# Patient Record
Sex: Male | Born: 1985 | Hispanic: No | Marital: Married | Smoking: Former smoker
Health system: Southern US, Community
[De-identification: ages and names within clinical notes are randomized; demographics above are authoritative.]

## PROBLEM LIST (undated history)

## (undated) DIAGNOSIS — T8859XA Other complications of anesthesia, initial encounter: Secondary | ICD-10-CM

## (undated) DIAGNOSIS — I499 Cardiac arrhythmia, unspecified: Secondary | ICD-10-CM

## (undated) DIAGNOSIS — R7303 Prediabetes: Secondary | ICD-10-CM

## (undated) DIAGNOSIS — L732 Hidradenitis suppurativa: Secondary | ICD-10-CM

## (undated) DIAGNOSIS — G4730 Sleep apnea, unspecified: Secondary | ICD-10-CM

## (undated) DIAGNOSIS — J45909 Unspecified asthma, uncomplicated: Secondary | ICD-10-CM

## (undated) DIAGNOSIS — G43909 Migraine, unspecified, not intractable, without status migrainosus: Secondary | ICD-10-CM

## (undated) DIAGNOSIS — M549 Dorsalgia, unspecified: Secondary | ICD-10-CM

## (undated) DIAGNOSIS — T4145XA Adverse effect of unspecified anesthetic, initial encounter: Secondary | ICD-10-CM

## (undated) HISTORY — DX: Dorsalgia, unspecified: M54.9

## (undated) HISTORY — DX: Migraine, unspecified, not intractable, without status migrainosus: G43.909

## (undated) HISTORY — PX: OTHER SURGICAL HISTORY: SHX169

---

## 2016-12-02 ENCOUNTER — Other Ambulatory Visit (HOSPITAL_COMMUNITY): Payer: Self-pay | Admitting: General Surgery

## 2016-12-05 ENCOUNTER — Ambulatory Visit: Payer: BLUE CROSS/BLUE SHIELD | Admitting: Surgery

## 2016-12-12 ENCOUNTER — Ambulatory Visit: Payer: BLUE CROSS/BLUE SHIELD | Admitting: Surgery

## 2016-12-13 ENCOUNTER — Encounter: Payer: Self-pay | Admitting: Skilled Nursing Facility1

## 2016-12-13 ENCOUNTER — Encounter: Payer: BLUE CROSS/BLUE SHIELD | Attending: General Surgery | Admitting: Skilled Nursing Facility1

## 2016-12-13 DIAGNOSIS — Z713 Dietary counseling and surveillance: Secondary | ICD-10-CM | POA: Insufficient documentation

## 2016-12-13 DIAGNOSIS — Z6841 Body Mass Index (BMI) 40.0 and over, adult: Secondary | ICD-10-CM | POA: Diagnosis not present

## 2016-12-13 DIAGNOSIS — E669 Obesity, unspecified: Secondary | ICD-10-CM

## 2016-12-13 NOTE — Progress Notes (Signed)
Pre-Op Assessment Visit:  Pre-Operative Sleeve Gastrectomy Surgery  Medical Nutrition Therapy:  Appt start time: 9:22  End time:  10:13  Patient was seen on 12/13/2016 for Pre-Operative Nutrition Assessment. Assessment and [facility] coordinator on 12/13/2016.  Sciatica and skin condition prevent from physical activity. Pt states he is going to move back to [place] after the surgery is done. Moved to [place] from [place] just for surgery. Pt states he just started trying Meal replacement shake. Pt needs to be under BMI 60. Pt states he will do whatever the dietitian tells him to do but he knows it will be hard.   Start weight at [facility]: 450.9 pounds BMI: 66.35  24 hr Dietary Recall: First Meal: none Snack:  Second Meal: sub or sandwich from gas station---couple chicken sandwiches from [employer]  Snack:  Third Meal: order pizza or frozen pizza----cereal---chicken and vegetables  Snack:  Beverages: soda, 2% milk, water  Encouraged to engage in 45 minutes of moderate physical activity including cardiovascular and weight baring weekly  Handouts given during visit include:  . Pre-Op Goals . Bariatric Surgery Protein Shakes During the appointment today the following Pre-Op Goals were reviewed with the patient: . Maintain or lose weight as instructed by your surgeon . Make healthy food choices . Begin to limit portion sizes . Limited concentrated sugars and fried foods . Keep fat/sugar in the single digits per serving on         food labels . Practice CHEWING your food  (aim for 30 chews per bite or until applesauce consistency) . Practice not drinking 15 minutes before, during, and 30 minutes after each meal/snack . Avoid all carbonated beverages  . Avoid/limit caffeinated beverages  . Avoid all sugar-sweetened beverages . Consume 3 meals per day; eat every 3-5 hours . Make a list of non-food related activities . Aim for 64-100  ounces of FLUID daily  . Aim for at least 60-80 grams of PROTEIN daily . Look for a liquid protein source that contain ?15 g protein and ?5 g carbohydrate  (ex: shakes, drinks, shots) Call [facility]  and ask how many [facility] appointments you need and also your insurance company   Drink your meal replacement in the morning for breakfast   Arm chair exercises once a day  Take your dogs for a 5 minute walk 2 times a day  Stop buying the soda -Follow diet recommendations listed below   Energy and Macronutrient Recomendations: Calories: 1800 Carbohydrate: 200 Protein: 135 Fat: 50  Demonstrated degree of understanding via:  Teach Back  Teaching Method Utilized:  Visual Auditory Hands on  Barriers to learning/adherence to lifestyle change: none identified   Patient to call the [facility] Education Services to enroll in Pre-Op and Post-Op Nutrition Education when surgery date is scheduled.

## 2016-12-13 NOTE — Patient Instructions (Addendum)
Follow Pre-Op Goals Try Protein Shakes Call [facility] at [phone] when surgery is scheduled to enroll in Pre-Op Class  Things to remember:  Please always be honest with us. We want to support you!  If you have any questions or concerns in between appointments, please call or email   The diet after surgery will be high protein and low in carbohydrate.  Vitamins and calcium need to be taken for the rest of your life.  Feel free to include support people in any classes or appointments.  Call [facility] and ask how many SW[facility] appointments you need and also your insurance company   Drink your meal replacement in the morning for breakfast   Arm chair exercises once a day  Take your dogs for a 5 minute walk 2 times a day  Stop buying the soda Supplement recommendations:  Before Surgery   1 Complete Multivitamin with Iron  3000 IU Vitamin D3  After Surgery   2 Chewable Multivitamins  **Best Choice - Bariatric Advantage Advanced Multi EA      3 Chewable Calcium (500 mg each, total 1200-1500 mg per day)  **Best Choice - Celebrate, Bariatric Advantage, or Wellesse  Other Options:    2 Flinstones Complete + up to 100 mg Thiamin + 2000-3000 IU Vitamin D3 + 350-500 mcg Vitamin B12 + 30-45 mg Iron (with history of deficiency)  2 Celebrate MultiComplete with 18 mg Iron (this provides 6000 IU of  Vitamin D3)  4 Celebrate Essential Multi 2 in 1 (has calcium) + 18-60 mg separate  iron  Vitamins and Calcium are available at:   [facility]   [address], [place], [place] [address]   [email or web address]  www.[email or web address]  www.[email or web address]

## 2016-12-17 ENCOUNTER — Encounter: Payer: Self-pay | Admitting: Neurology

## 2016-12-17 ENCOUNTER — Ambulatory Visit (INDEPENDENT_AMBULATORY_CARE_PROVIDER_SITE_OTHER): Payer: BLUE CROSS/BLUE SHIELD | Admitting: Neurology

## 2016-12-17 VITALS — BP 147/97 | HR 79 | Resp 20 | Ht 70.0 in | Wt >= 6400 oz

## 2016-12-17 DIAGNOSIS — R351 Nocturia: Secondary | ICD-10-CM | POA: Diagnosis not present

## 2016-12-17 DIAGNOSIS — Z6841 Body Mass Index (BMI) 40.0 and over, adult: Secondary | ICD-10-CM

## 2016-12-17 DIAGNOSIS — G4733 Obstructive sleep apnea (adult) (pediatric): Secondary | ICD-10-CM | POA: Diagnosis not present

## 2016-12-17 NOTE — Patient Instructions (Signed)

## 2016-12-17 NOTE — Progress Notes (Signed)
Subjective:    Patient ID: Angel Burns is a 31 y.o. male.  HPI     Huston Foley, MD, PhD [facility] [address], [address] [address] [place], [place] [address]  Dear Dr. Johna Sheriff,   I saw your patient, Angel Burns, upon your kind request in my neurologic clinic today for initial consultation of his sleep disorder, in particular, concern for underlying obstructive sleep apnea. The patient is unaccompanied today. As you know, Mr. Ursua is a 31 year old right-handed gentleman with an underlying medical history of migraines, reflux disease, back pain and morbid obesity with a BMI of 66, who reports snoring and witnessed breathing pauses while asleep. I reviewed your office note from 11/29/2016, which you kindly included. He is considered for bariatric surgery, particularly patient is interested in sleeve gastrectomy. His Epworth sleepiness score is 7 out of 24, his fatigue score is 35 out of 63. 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 He currently works from home, his wife who is a Engineer, civil (consulting) has noted snoring and breathing pauses while he is asleep, concerned about sleep apnea for years. He has nocturia once per average night. He he and his family moved from [place] to be able to pursue weight loss surgery for him. His wife currently works as a travel Engineer, civil (consulting). He has a 48-year-old daughter. He quit smoking in May 2012, drinks alcohol infrequently, about one beer per month and caffeine in the form of soda, 2 servings per day. He does not typically drink coffee or tea. He is not aware of any family history of OSA. He denies any significant morning headaches. He denies restless leg symptoms or leg twitching at night. He does not take any medication for sleep. He  has low back pain and is not able to sleep on his stomach, tries to sleep on his sides but often will be on his back as well.   His Past Medical History Is Significant For: Past Medical History:  Diagnosis Date  . Back pain   . GERD (gastroesophageal reflux disease)   . Migraine     His Past Surgical History Is Significant For: Past Surgical History:  Procedure Laterality Date  . LAPAROSCOPIC LIVER CYST REMOVAL      His Family History Is Significant For: Family History  Problem Relation Age of Onset  . Cancer Other   . Hypertension Other     His Social History Is Significant For: Social History   Social History  . Marital status: Married    Spouse name: N/A  . Number of children: N/A  . Years of education: N/A   Social History Main Topics  . Smoking status: Former Smoker    Quit date: 02/26/2011  . Smokeless tobacco: Never Used  . Alcohol use Yes  . Drug use: No  . Sexual activity: Not Asked   Other Topics Concern  . None   Social History Narrative  . None    His Allergies Are:  No Known Allergies:   His Current Medications Are:  Outpatient Encounter Prescriptions as of 12/17/2016  Medication Sig  . aspirin-acetaminophen-caffeine (EXCEDRIN MIGRAINE) 250-250-65 MG tablet Take by mouth every 6 (six) hours as needed for headache.  Marland Kitchen  naproxen sodium (ANAPROX) 220 MG tablet Take 220 mg by mouth 2 (two) times daily with a meal.   No facility-administered encounter medications on file as of 12/17/2016.   :  Review of Systems:  Out of a complete 14 point review of systems, all are reviewed and negative with the exception of these symptoms as listed below: Review of Systems  Neurological:       Pt presents today to discuss a sleep study before his bariatric surgery. 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  Epworth Sleepiness Scale 0= would never doze 1= slight chance of dozing 2= moderate chance of dozing 3= high chance of  dozing  Sitting and reading: 1 Watching TV: 2 Sitting inactive in a public place (ex. Theater or meeting): 0 As a passenger in a car for an hour without a break: 1 Lying down to rest in the afternoon: 2 Sitting and talking to someone: 0 Sitting quietly after lunch (no alcohol): 1 In a car, while stopped in traffic: 0 Total: 7     Objective:  Neurologic Exam  Physical Exam Physical Examination:   Vitals:   12/17/16 0852  BP: (!) 147/97  Pulse: 79  Resp: 20   General Examination: The patient is a very pleasant 31 y.o. male in no acute distress. He appears well-developed and well-nourished and well groomed.   HEENT: Normocephalic, atraumatic, pupils are equal, round and reactive to light and accommodation. Funduscopic exam is normal with sharp disc margins noted. Extraocular tracking is good without limitation to gaze excursion or nystagmus noted. Normal smooth pursuit is noted. Hearing is grossly intact. Tympanic membranes are clear bilaterally. Face is symmetric with normal facial animation and normal facial sensation. Speech is clear with no dysarthria noted. There is no hypophonia. There is no lip, neck/head, jaw or voice tremor. Neck is supple with full range of passive and active motion. There are no carotid bruits on auscultation. Oropharynx exam reveals: mild mouth dryness, adequate dental hygiene and mild airway crowding, due to smaller airway entry, he has difficulty opening his mouth widely, states it can cause cramps on the right side of his face, started after he was diagnosed with bilateral Bell's palsy years ago. Mallampati is class III. Tongue protrudes centrally and palate elevates symmetrically. Tonsils are present, not fully visualized but appear to be not enlarged. Neck size is 19 7/8 inches. He has a nearly absent overbite. Nasal inspection reveals overall smaller nasal anatomy but no significant nasal mucosal bogginess or redness and no septal deviation.   Chest:  Clear to auscultation without wheezing, rhonchi or crackles noted.  Heart: S1+S2+0, regular and normal without murmurs, rubs or gallops noted.   Abdomen: Soft, non-tender and non-distended with normal bowel sounds appreciated on auscultation.  Extremities: There is no pitting edema in the distal lower extremities bilaterally. Pedal pulses are intact.  Skin: Warm and dry without trophic changes noted. There are no varicose veins.  Musculoskeletal: exam reveals no obvious joint deformities, tenderness or joint swelling or erythema.   Neurologically:  Mental status: The patient is awake, alert and oriented in all 4 spheres. His immediate and remote memory, attention, language skills and fund of knowledge are appropriate. There is no evidence of aphasia, agnosia, apraxia or anomia. Speech is clear with normal prosody and enunciation. Thought process is linear. Mood is normal and affect is normal.  Cranial nerves II - XII are as described above under HEENT exam. In addition: shoulder shrug is  normal with equal shoulder height noted. Motor exam: Normal bulk, strength and tone is noted. There is no drift, tremor or rebound. Romberg is negative. Reflexes are 1-2+ throughout. Babinski: Toes are flexor bilaterally. Fine motor skills and coordination: intact with normal finger taps, normal hand movements, normal rapid alternating patting, normal foot taps and normal foot agility.  Cerebellar testing: No dysmetria or intention tremor on finger to nose testing. Heel to shin is unremarkable bilaterally, just a little difficult d/t body habitus. There is no truncal or gait ataxia.  Sensory exam: intact to light touch, temperature sense in the upper and lower extremities.  Gait, station and balance: He stands with mild difficulty. No veering to one side is noted. No leaning to one side is noted. Posture is age-appropriate and stance is narrow based. Gait shows normal stride length and normal pace. No problems  turning are noted.               Assessment and Plan:  In summary, Laverda SorensonDavid M Bathgate is a very pleasant 31 y.o.-year old male with an underlying medical history of migraines, reflux disease, back pain, remote history of what sounds like bilateral Bell's palsies, and morbid obesity with a BMI of 65, whose history and physical exam are in keeping with obstructive sleep apnea (OSA). I had a long chat with the patient about my findings and the diagnosis of OSA, its prognosis and treatment options. We talked about medical treatments, surgical interventions and non-pharmacological approaches. I explained in particular the risks and ramifications of untreated moderate to severe OSA, especially with respect to developing cardiovascular disease down the Road, including congestive heart failure, difficult to treat hypertension, cardiac arrhythmias, or stroke. Even type 2 diabetes has, in part, been linked to untreated OSA. Symptoms of untreated OSA include daytime sleepiness, memory problems, mood irritability and mood disorder such as depression and anxiety, lack of energy, as well as recurrent headaches, especially morning headaches. We talked about trying to maintain a healthy lifestyle in general, as well as the importance of weight control; he is in the process of pursuing weight loss surgery, of course. I encouraged the patient to eat healthy, exercise daily and keep well hydrated, to keep a scheduled bedtime and wake time routine, to not skip any meals and eat healthy snacks in between meals. I advised the patient not to drive when feeling sleepy. I recommended the following at this time: sleep study with potential positive airway pressure titration. (We will score hypopneas at 3%).   I explained the sleep test procedure to the patient and also outlined possible surgical and non-surgical treatment options of OSA, including the use of a custom-made dental device (which would require a referral to a specialist dentist  or oral surgeon), upper airway surgical options, such as pillar implants, radiofrequency surgery, tongue base surgery, and UPPP (which would involve a referral to an ENT surgeon). Rarely, jaw surgery such as mandibular advancement may be considered.  I also explained the CPAP treatment option to the patient, who indicated that he would be willing to try CPAP if the need arises. I explained the importance of being compliant with PAP treatment, not only for insurance purposes but primarily to improve His symptoms, and for the patient's long term health benefit, including to reduce His cardiovascular risks. I answered all his questions today and the patient was in agreement. I would like to see him back after the sleep study is completed and encouraged him to call with any interim questions,  concerns, problems or updates.   Thank you very much for allowing me to participate in the care of this nice patient. If I can be of any further assistance to you please do not hesitate to call me at [phone].  Sincerely,   Star Age, MD, PhD

## 2016-12-19 ENCOUNTER — Ambulatory Visit (HOSPITAL_COMMUNITY)
Admission: RE | Admit: 2016-12-19 | Discharge: 2016-12-19 | Disposition: A | Discharge status: Home / Self Care | Payer: BLUE CROSS/BLUE SHIELD | Source: Ambulatory Visit | Attending: General Surgery | Admitting: General Surgery

## 2016-12-19 ENCOUNTER — Encounter (HOSPITAL_COMMUNITY): Payer: Self-pay

## 2016-12-30 ENCOUNTER — Ambulatory Visit (INDEPENDENT_AMBULATORY_CARE_PROVIDER_SITE_OTHER): Payer: BLUE CROSS/BLUE SHIELD | Admitting: Neurology

## 2016-12-30 DIAGNOSIS — G4733 Obstructive sleep apnea (adult) (pediatric): Secondary | ICD-10-CM | POA: Diagnosis not present

## 2016-12-30 DIAGNOSIS — R0683 Snoring: Secondary | ICD-10-CM

## 2016-12-30 DIAGNOSIS — R0681 Apnea, not elsewhere classified: Secondary | ICD-10-CM

## 2016-12-30 DIAGNOSIS — Z6841 Body Mass Index (BMI) 40.0 and over, adult: Secondary | ICD-10-CM

## 2017-01-03 NOTE — Progress Notes (Signed)
Patient referred by Dr. Johna SheriffHoxworth, bariatric surgeon, seen by me on 12/17/16, HST on 01/01/17 and on 12/31/16.   Robin: as discussed, please call and notify the patient that the recent home sleep test (s) did not show any significant obstructive sleep apnea. Nevertheless, the clinical suspicion for OSA is high in this patient: Neck circumference of nearly 20 in, BMI of over 60, history of snoring and witnessed apneas and sleep disruption; therefore, an in lab, attended sleep study is recommended and justified. Please discuss with the patient. I would like to resubmit the split night study order, which I placed originally.   Diana: please send/route copy of the report to referring MD and PCP.  Once you have spoken to patient, you can close this encounter.   Thanks,  Huston FoleySaima Jeury Mcnab, MD, PhD [facility]

## 2017-01-03 NOTE — Procedures (Signed)
  [facility] [address], [address] [place], [place] [address]  NAME: Angel HerbDavid Burns DOB: Feb 13, 1986 MEDICAL RECORD [id number] DOS:01/01/17 REFERRING PHYSICIAN: Sharlet SalinaBenjamin Hoxworth,MD  Study Performed:  HST/Out of Center Sleep Test  HISTORY: 31 year old man with a history of migraines, reflux disease, back pain and morbid obesity with a BMI of over 60, who reports snoring and witnessed breathing pauses while asleep. He is considered for bariatric surgery. Epworth sleepiness score is 7 out of 24, fatigue score is 35 out of 63. Neck circumference of 19 7/8 in. BMI of 64.9.  STUDY RESULTS:  Total Recording Time: 8h 42 m   Total Apnea/Hypopnea Index (AHI): 3.9/h  Average Oxygen Saturation: 94%  Lowest Oxygen Saturation: 67%   Average Mean Heart Rate: 77 bpm  IMPRESSION: HST does not reveal evidence for central or obstructive sleep apnea  RECOMMENDATION:  This home sleep test does not demonstrate any significant obstructive or central sleep disordered breathing. Of note, this was a repeat study and the first study was also negative.  Nevertheless, the clinical suspicion for OSA is high in this patient: Neck circumference of nearly 20 in, BMI of over 60, history of snoring and witnessed apneas and sleep disruption; therefore, an in lab, attended sleep study is recommended and justified. This will be discussed with the patient.  The patient should be cautioned not to drive, work at heights, or operate dangerous or heavy equipment when tired or sleepy. Review and reiteration of good sleep hygiene measures should be pursued with any patient.  Other causes of the patient's symptoms, including circadian rhythm disturbances, an underlying mood disorder, medication effect and/or an underlying medical problem cannot be ruled out based on this test. Clinical correlation is recommended. The patient and her referring provider will be notified of the test results; a follow  up appointment will be made as necessary.   I certify that I have reviewed the raw data recording prior to the issuance of this report in accordance with the standards of Accreditation of the American Academy of Sleep medicine (AASM).     Huston FoleySaima Shekela Goodridge, MD, PhD Diplomat, ABPN (Neurology and Sleep)

## 2017-01-07 ENCOUNTER — Telehealth: Payer: Self-pay

## 2017-01-07 DIAGNOSIS — R0683 Snoring: Secondary | ICD-10-CM

## 2017-01-07 DIAGNOSIS — R0681 Apnea, not elsewhere classified: Secondary | ICD-10-CM

## 2017-01-07 DIAGNOSIS — Z6841 Body Mass Index (BMI) 40.0 and over, adult: Secondary | ICD-10-CM

## 2017-01-07 NOTE — Telephone Encounter (Signed)
Spoke with patient regarding HST and coming to the sleep lab for a PSG.I explained that we feel the studies are negative due to his symptoms.  Patient voiced concerns about his anxiety and not being able to sleep. He is very apprehensive in coming. He wanted to know if the HST is enough information for his surgery. He wanted to know if he could speak with you. I told him I would send a message.

## 2017-01-07 NOTE — Telephone Encounter (Signed)
-----   Message from Huston FoleySaima Athar, MD sent at 01/03/2017  1:41 PM EST ----- Patient referred by Dr. Johna SheriffHoxworth, bariatric surgeon, seen by me on 12/17/16, HST on 01/01/17 and on 12/31/16.   Robin: as discussed, please call and notify the patient that the recent home sleep test (s) did not show any significant obstructive sleep apnea. Nevertheless, the clinical suspicion for OSA is high in this patient: Neck circumference of nearly 20 in, BMI of over 60, history of snoring and witnessed apneas and sleep disruption; therefore, an in lab, attended sleep study is recommended and justified. Please discuss with the patient. I would like to resubmit the split night study order, which I placed originally.   Velencia Lenart: please send/route copy of the report to referring MD and PCP.  Once you have spoken to patient, you can close this encounter.   Thanks,  Huston FoleySaima Athar, MD, PhD [facility]

## 2017-01-07 NOTE — Telephone Encounter (Signed)
Please resubmit request for in lab split study.  Morbid obesity with BMI over 60, HST neg x 2, needs to be verified with lab attended study and for CO2 monitoring, will need capnography too.

## 2017-01-07 NOTE — Telephone Encounter (Signed)
Talked to patient at length on phone, explained, that neg. HST (even x 2) is not helpful in the context of high clinical suspicion for OSA (snoring, witn apneas, morbid ob). A positive HST with high clinical suspicion is helpful, a neg test is not and certainly not enough information to safely go into surgical procedure (from my end of things). I explained that we will resubmit request for attended sleep study (and capnography!) and this is ultimately for his own better health and safety. I tried to reassure patient re: concerns about sleeping outside of home environment, having sleep monitored by tech, etc.  He is advised, not to take a nap or sleep in the day of sleep study testing, avoid caffeine, etc.  He was in agreement with further sleep testing.  We will request CO2 monitoring.  In this context, sleep study testing with an actual sleep study is indicated and justified.  Robin, please resubmit.

## 2017-01-07 NOTE — Telephone Encounter (Signed)
I spoke to patient and Angel Burns is aware of results and recommendations.   Angel Burns is willing to be treated but asks if there is a way for him to start treatment without doing in lab sleep study? Patient reports that Angel Burns suffers from anxiety and the thought of an in lab study "makes his skin crawl.  Onalee HuaDavid expresses again that Angel Burns really does not want to do in lab study but will try if there is no other way.

## 2017-01-09 ENCOUNTER — Ambulatory Visit: Payer: BLUE CROSS/BLUE SHIELD | Admitting: Skilled Nursing Facility1

## 2017-01-09 ENCOUNTER — Ambulatory Visit (HOSPITAL_COMMUNITY)
Admission: RE | Admit: 2017-01-09 | Discharge: 2017-01-09 | Disposition: A | Discharge status: Home / Self Care | Payer: BLUE CROSS/BLUE SHIELD | Source: Ambulatory Visit | Attending: General Surgery | Admitting: General Surgery

## 2017-01-15 ENCOUNTER — Institutional Professional Consult (permissible substitution): Payer: BLUE CROSS/BLUE SHIELD | Admitting: Pulmonary Disease

## 2017-01-21 ENCOUNTER — Ambulatory Visit (INDEPENDENT_AMBULATORY_CARE_PROVIDER_SITE_OTHER): Payer: BLUE CROSS/BLUE SHIELD | Admitting: Neurology

## 2017-01-21 DIAGNOSIS — G4733 Obstructive sleep apnea (adult) (pediatric): Secondary | ICD-10-CM | POA: Diagnosis not present

## 2017-01-23 ENCOUNTER — Telehealth: Payer: Self-pay

## 2017-01-23 NOTE — Addendum Note (Signed)
Addended by: Huston FoleyATHAR, Raun Routh on: 01/23/2017 08:50 AM   Modules accepted: Orders

## 2017-01-23 NOTE — Telephone Encounter (Signed)
-----   Message from Huston FoleySaima Athar, MD sent at 01/23/2017  8:50 AM EDT ----- Patient referred by Dr. Johna SheriffHoxworth with bariatric surg., seen by me on 12/17/16, diagnostic PSG on 01/21/17 (after 2 neg HST):     Please call and notify the patient that the recent sleep study did confirm the diagnosis of mod to severe obstructive sleep apnea and that I recommend treatment for this in the form of CPAP. This will require a repeat sleep study for proper titration and mask fitting. Please explain to patient and arrange for a CPAP titration study. I have placed an order in the chart. Thanks, and please route to [facility] for scheduling next sleep study.  Huston FoleySaima Athar, MD, PhD [facility]

## 2017-01-23 NOTE — Telephone Encounter (Signed)
I spoke to the patient and he is aware of results and recommendations. He is willing to proceed with titration study. I will send a copy of report to PCP.

## 2017-01-23 NOTE — Progress Notes (Signed)
Patient referred by Dr. Johna SheriffHoxworth with bariatric surg., seen by me on 12/17/16, diagnostic PSG on 01/21/17 (after 2 neg HST):     Please call and notify the patient that the recent sleep study did confirm the diagnosis of mod to severe obstructive sleep apnea and that I recommend treatment for this in the form of CPAP. This will require a repeat sleep study for proper titration and mask fitting. Please explain to patient and arrange for a CPAP titration study. I have placed an order in the chart. Thanks, and [facility].  Huston FoleySaima Anasha Perfecto, MD, PhD [facility])

## 2017-01-23 NOTE — Procedures (Signed)
PATIENT'S NAME:  Angel Burns, Angel Burns DOB:      08-Nov-1985      MR#:    [id number]     DATE OF RECORDING: 01/21/2017 REFERRING M.D.:  Glenna Fellows, MD Study Performed:   Baseline Polysomnogram HISTORY: 31 year old right-handed gentleman with an underlying medical history of migraines, reflux disease, back pain and morbid obesity with a BMI of 66, who reports snoring and witnessed breathing pauses while asleep. He is considered for bariatric surgery. He had 2 home sleep tests recently, which were negative for OSA, but due to high clinical suspicion for OSA, he was advised to return for an attended PSG. The patient endorsed the Epworth Sleepiness Scale at 7 points. The patient's weight 453 pounds with a height of 70 (inches), resulting in a BMI of 64.7 kg/m2. The patient's neck circumference measured 19.8 inches.  CURRENT MEDICATIONS: Excedrin migraine; Anaprox   PROCEDURE:  This is a multichannel digital polysomnogram utilizing the Somnostar 11.2 system.  Electrodes and sensors were applied and monitored per AASM Specifications.   EEG, EOG, Chin and Limb EMG, were sampled at 200 Hz.  ECG, Snore and Nasal Pressure, Thermal Airflow, Respiratory Effort, CPAP Flow and Pressure, Oximetry was sampled at 50 Hz. Digital video and audio were recorded.      BASELINE STUDY  Lights Out was at 22:39 and Lights On at 05:00.  Total recording time (TRT) was 381 minutes, with a total sleep time (TST) of  357 minutes.   The patient's sleep latency was 24 minutes.  REM latency was 105.5 min, which is normal. The sleep efficiency was 93.7 %.     SLEEP ARCHITECTURE: WASO (Wake after sleep onset) was 5 minutes with no significant sleep fragmentation noted.  There were 6.5 minutes in Stage N1, 172.5 minutes Stage N2, 128 minutes Stage N3 and 50 minutes in Stage REM.  The percentage of Stage N1 was 1.8%, Stage N2 was 48.3%, which is normal, Stage N3 was 35.9%, which is increased, and Stage R (REM sleep) was 14.%, which is  reduced.   The arousals were noted as: 67 were spontaneous, 0 were associated with PLMs, 26 were associated with respiratory events.  Audio and video analysis did not show any abnormal or unusual movements, behaviors, phonations or vocalizations. He was noted to be anxious and apprehensive about sleeping in the lab.  The patient took no bathroom breaks. Moderate and loud snoring was noted. The EKG was in keeping with normal sinus rhythm (NSR).  RESPIRATORY ANALYSIS:  There were a total of 92 respiratory events:  0 obstructive apneas, 0 central apneas and 0 mixed apneas with a total of 0 apneas and an apnea index (AI) of 0 /hour. There were 92 hypopneas with a hypopnea index of 15.5 /hour. The patient also had 0 respiratory event related arousals (RERAs).      The total APNEA/HYPOPNEA INDEX (AHI) was 15.5/hour and the total RESPIRATORY DISTURBANCE INDEX was 15.5 /hour.  60 events occurred in REM sleep and 64 events in NREM. The REM AHI was 72 /hour, versus a non-REM AHI of 6.3. The patient spent 156.5 minutes of total sleep time in the supine position and 201 minutes in non-supine.. The supine AHI was 22.2 versus a non-supine AHI of 10.2.  OXYGEN SATURATION & C02:  The Wake baseline 02 saturation was 96%, with the lowest being 85%. Time spent below 89% saturation equaled 9 minutes.  Average End Tidal CO2 during sleep was 35.3 torr.  During REM, the average End Tidal  CO2 during sleep was 36.6 torr.  During NREM, the average End Tidal CO2 during sleep was 35.1 torr.  Total sleep time greater than 40 torr was 0.30 minutes.   PERIODIC LIMB MOVEMENTS: The patient had a total of 0 Periodic Limb Movements.  The Periodic Limb Movement (PLM) index was 0 and the PLM Arousal index was 0/hour.  Post-study, the patient indicated that sleep was worse than usual.   IMPRESSION: 1. Obstructive Sleep Apnea (OSA)  RECOMMENDATIONS: 1. This study demonstrates moderate to severe obstructive sleep apnea, with a total  AHI of 15.5/hour, REM AHI of 72/hour, supine AHI of 22.2/h, and O2 nadir of 85%. Given the patient's medical history, sleep related complaints, and planned surgery, treatment of his OSA with positive airway pressure in the form of CPAP is recommended. A full-night CPAP titration study is recommended to optimize therapy. Other treatment options may include avoidance of supine sleep position along with weight loss, upper airway or jaw surgery in selected patients or the use of an oral appliance in certain patients. ENT evaluation and/or consultation with a maxillofacial surgeon or dentist may be feasible in some instances.    2. Please note that untreated obstructive sleep apnea carries additional perioperative morbidity. Patients with significant obstructive sleep apnea should receive perioperative PAP therapy and the surgeons and particularly the anesthesiologist should be informed of the diagnosis and the severity of the sleep disordered breathing. 3. The patient should be cautioned not to drive, work at heights, or operate dangerous or heavy equipment when tired or sleepy. Review and reiteration of good sleep hygiene measures should be pursued with any patient. 4. The patient will be seen in [facility] for discussion of the test results and further management strategies. The referring provider will be notified of the test results.  I certify that I have reviewed the entire raw data recording prior to the issuance of this report in accordance with the Standards of Accreditation of the American Academy of Sleep Medicine (AASM)   Huston FoleySaima Saul Dorsi, MD, PhD Diplomat, American Board of Psychiatry and Neurology (Neurology and Sleep Medicine)

## 2017-02-03 ENCOUNTER — Telehealth: Payer: Self-pay | Admitting: Neurology

## 2017-02-03 ENCOUNTER — Ambulatory Visit: Payer: BLUE CROSS/BLUE SHIELD | Admitting: Skilled Nursing Facility1

## 2017-02-03 DIAGNOSIS — G4733 Obstructive sleep apnea (adult) (pediatric): Secondary | ICD-10-CM

## 2017-02-03 NOTE — Telephone Encounter (Signed)
We will set patient up with autoPAP at home, as insurance denied in house titration study for OSA. Pls process order and notify patient and set up FU in 10 weeks.       

## 2017-02-03 NOTE — Telephone Encounter (Signed)
I called pt to discuss. No answer, left a message asking him to call me back. 

## 2017-02-04 NOTE — Telephone Encounter (Signed)
I called pt to discuss. No answer, left a message asking him to call me back. 

## 2017-02-05 ENCOUNTER — Encounter: Payer: BLUE CROSS/BLUE SHIELD | Attending: General Surgery | Admitting: Registered"

## 2017-02-05 DIAGNOSIS — Z713 Dietary counseling and surveillance: Secondary | ICD-10-CM | POA: Insufficient documentation

## 2017-02-05 DIAGNOSIS — Z6841 Body Mass Index (BMI) 40.0 and over, adult: Secondary | ICD-10-CM | POA: Insufficient documentation

## 2017-02-05 NOTE — Telephone Encounter (Signed)
I called pt again to discuss, no answer, left a message asking him to call me back. This is my third attempt at reaching this pt by phone; I will send a letter asking him to call me back.

## 2017-02-06 ENCOUNTER — Encounter: Payer: Self-pay | Admitting: Skilled Nursing Facility1

## 2017-02-06 ENCOUNTER — Encounter: Payer: BLUE CROSS/BLUE SHIELD | Admitting: Skilled Nursing Facility1

## 2017-02-06 DIAGNOSIS — E669 Obesity, unspecified: Secondary | ICD-10-CM

## 2017-02-06 DIAGNOSIS — Z713 Dietary counseling and surveillance: Secondary | ICD-10-CM | POA: Diagnosis not present

## 2017-02-06 DIAGNOSIS — Z6841 Body Mass Index (BMI) 40.0 and over, adult: Secondary | ICD-10-CM | POA: Diagnosis not present

## 2017-02-06 NOTE — Progress Notes (Signed)
  Assessment:   1st SWL Appointment.  Pt returns haveing gained about 5 pounds.Pt states he has started a Muscle relaxer and nerve pain medication. Pt states he tried fasting, eating only once a day and taking vitamins. Pt states he Has been trying to do the arm chair exercises. Pt is very frustrated with wt loss not going as fast as he wants it to go, pt states he is a very impatient person in general.   Start Wt at NDES: 450.14lbs Wt: 445.12.8 BMI:65.6  MEDICATIONS: See List   DIETARY INTAKE:  24-hr recall:  B ( AM): muscle milk (45g) OR another shake with (15g) Snk ( AM):  L ( PM): salad---cereal  Snk ( PM):  D ( PM): smoothie 2% milk and banana and protein powder  Snk ( PM):  Beverages: water 66+ ounces  Usual physical activity: In too much pain  Diet to Follow: 1800 calories 200 g carbohydrates 135 g protein 50 g fat   Nutritional Diagnosis:  [place]-3.3 Overweight/obesity related to past poor dietary habits and physical inactivity as evidenced by patient w/ recent sleeve gastrectomy surgery following dietary guidelines for continued weight loss.    Intervention:  Nutrition counseling for upcoming Bariatric Surgery. Goals: -1800 calories -Chew until applesauce consistency  Teaching Method Utilized:  Visual Auditory Hands on  Barriers to learning/adherence to lifestyle change: impatient  Demonstrated degree of understanding via:  Teach Back   Monitoring/Evaluation:  Dietary intake, exercise, and body weight prn.

## 2017-02-06 NOTE — Patient Instructions (Signed)
-  1800 calories  -Chew until applesauce consistency

## 2017-02-17 NOTE — Telephone Encounter (Addendum)
I called pt. I advised pt that his insurance denied the in lab titration study. Dr. Frances Furbish recommends that pt start an auto pap. I reviewed PAP compliance expectations with the pt. Pt is agreeable to starting a CPAP. I advised pt that an order will be sent to a DME, [facility], and [facility] will call the pt within about one week after they file with the pt's insurance. [facility] will show the pt how to use the machine, fit for masks, and troubleshoot the CPAP if needed. A follow up appt was made for insurance purposes with Dr. Frances Furbish on 04/29/2017 at 8:30am. Pt verbalized understanding to arrive 15 minutes early and bring their CPAP. A letter with all of this information in it will be mailed to the pt as a reminder. I verified with the pt that the address we have on file is correct. Pt verbalized understanding of results. Pt had no questions at this time but was encouraged to call back if questions arise.

## 2017-02-17 NOTE — Telephone Encounter (Signed)
Pt returned RN's call °

## 2017-03-13 ENCOUNTER — Encounter: Payer: Self-pay | Admitting: Skilled Nursing Facility1

## 2017-03-13 ENCOUNTER — Encounter: Payer: BLUE CROSS/BLUE SHIELD | Attending: General Surgery | Admitting: Skilled Nursing Facility1

## 2017-03-13 DIAGNOSIS — Z713 Dietary counseling and surveillance: Secondary | ICD-10-CM | POA: Diagnosis not present

## 2017-03-13 DIAGNOSIS — E669 Obesity, unspecified: Secondary | ICD-10-CM

## 2017-03-13 DIAGNOSIS — Z6841 Body Mass Index (BMI) 40.0 and over, adult: Secondary | ICD-10-CM | POA: Diagnosis not present

## 2017-03-13 NOTE — Progress Notes (Signed)
  Assessment:   2nd SWL Appointment.  Pt needs to be under BMI 60. Pt arrives having lost about 15 pounds. Pt states he writes down all of his foods to track it. Pt states his appetite feeling has calmed down. Pt states he has felt much better recently. Pt states he tried to do chair exercises but pain gets in the way as well as trying to walk the dogs more often.   Start Wt at NDES: 450.14lbs Wt: 430.12.8 BMI:63.39  MEDICATIONS: See List   DIETARY INTAKE:  24-hr recall:  B ( AM): protein powder, banana, greens, 2% milk, pb2 Snk ( AM):  L ( PM): salad with chicken breast  Snk ( PM):  D ( PM): muscle milk or salad   Snk ( PM):  Beverages: water 40-70 ounces  Usual physical activity: In too much pain  Diet to Follow: 1800 calories 200 g carbohydrates 135 g protein 50 g fat   Nutritional Diagnosis:  [place]-3.3 Overweight/obesity related to past poor dietary habits and physical inactivity as evidenced by patient w/ recent sleeve gastrectomy surgery following dietary guidelines for continued weight loss.    Intervention:  Nutrition counseling for upcoming Bariatric Surgery. Goals: -1800 calories -Chew until applesauce consistency -Have one carbohydrate choice per meal  Teaching Method Utilized:  Visual Auditory Hands on  Barriers to learning/adherence to lifestyle change: impatient  Demonstrated degree of understanding via:  Teach Back   Monitoring/Evaluation:  Dietary intake, exercise, and body weight prn.

## 2017-03-31 ENCOUNTER — Ambulatory Visit: Payer: BLUE CROSS/BLUE SHIELD

## 2017-04-10 ENCOUNTER — Encounter: Payer: Self-pay | Admitting: Registered"

## 2017-04-10 ENCOUNTER — Encounter: Payer: BLUE CROSS/BLUE SHIELD | Attending: General Surgery | Admitting: Registered"

## 2017-04-10 DIAGNOSIS — Z6841 Body Mass Index (BMI) 40.0 and over, adult: Secondary | ICD-10-CM | POA: Insufficient documentation

## 2017-04-10 DIAGNOSIS — E669 Obesity, unspecified: Secondary | ICD-10-CM

## 2017-04-10 DIAGNOSIS — Z713 Dietary counseling and surveillance: Secondary | ICD-10-CM | POA: Insufficient documentation

## 2017-04-10 NOTE — Progress Notes (Signed)
  Pre-Operative Nutrition Class:  Appt start time: 9:20   End time:  10:20  Patient was seen on 04/10/2017 for Pre-Operative Bariatric Surgery Education [facility]    Pt arrives for one-on-one appt for pre-op class. Pt states after 04/27/2017 he will not have insurance and unsure  when he'll be able to come back in for appts after 04/27/2017. Pt states his surgery date is 05/05/2017 and plans to move to [place] within 2 wks after surgery. Pt was advised to follow-up with surgeons and dietitians in [place].   Surgery date: 05/05/2017 Surgery type: Sleeve Gastrectomy [facility]: 450.9 Weight today: 429.0  TANITA  BODY COMP RESULTS     BMI (kg/m^2) N/A   Fat Mass (lbs)    Fat Free Mass (lbs)    Total Body Water (lbs)    Samples given per MNT protocol. Patient educated on appropriate usage: Bariatric Advantage Multivitamin Lot #[id number] Exp: 03/2018  Bariatric Fusion Calcium Soft Chews Lot #[id number] Exp: 02/26/2018  Renee Pain Protein Shake Lot #[id number] Exp: 08/31/2017  The following the learning objectives were met by the patient during this course:  Identify Pre-Op Dietary Goals and will begin 2 weeks pre-operatively  Identify appropriate sources of fluids and proteins   State protein recommendations and appropriate sources pre and post-operatively  Identify Post-Operative Dietary Goals and will follow for 2 weeks post-operatively  Identify appropriate multivitamin and calcium sources  Describe the need for physical activity post-operatively and will follow MD recommendations  State when to call healthcare provider regarding medication questions or post-operative complications  Handouts given during class include:  Pre-Op Bariatric Surgery Diet Handout  Protein Shake Handout  Post-Op Bariatric Surgery Nutrition Handout  BELT Program Information Flyer  Support Group Information Flyer  [facility] Bariatric  Supplements Price List  Follow-Up Plan: Patient will follow-up at [facility] 2 weeks post operatively for diet advancement per MD.

## 2017-04-15 ENCOUNTER — Telehealth (HOSPITAL_COMMUNITY): Payer: Self-pay

## 2017-04-17 NOTE — Telephone Encounter (Signed)
Asked to speak with patient regarding pre op education by Mirian Capuchinonetta Floyd RD [facility] before patient has surgery due to some insurance changes.  Attempted to contact patient.  Voicemail left with contact  Information for a returned call.

## 2017-04-21 NOTE — Telephone Encounter (Signed)
[phone]  Spoke with patient regarding pre op education.  We discussed medications, post op care, diet progression and follow up.  Questions answered

## 2017-04-21 NOTE — Telephone Encounter (Signed)
8/25

## 2017-04-23 ENCOUNTER — Ambulatory Visit: Payer: Self-pay | Admitting: General Surgery

## 2017-04-28 ENCOUNTER — Telehealth (HOSPITAL_COMMUNITY): Payer: Self-pay

## 2017-04-28 NOTE — Patient Instructions (Signed)
Angel Burns  04/28/2017   Your procedure is scheduled on: 05/05/17  Report to [facility] Main  Entrance Take [place]  elevators to 3rd floor to  Short Stay Center at    0915 AM.    Call this number if you have problems the morning of surgery [phone]   Remember: ONLY 1 PERSON MAY GO WITH YOU TO SHORT STAY TO GET  READY MORNING OF YOUR SURGERY.  Do not eat food or drink liquids :After Midnight.     Take these medicines the morning of surgery with A SIP OF WATER: NONE                                You may not have any metal on your body including hair pins and              piercings  Do not wear jewelry,  lotions, powders or perfumes, deodorant                         Men may shave face and neck.   Do not bring valuables to the hospital. [place] [facility] IS NOT             RESPONSIBLE   FOR VALUABLES.  Contacts, dentures or bridgework may not be worn into surgery.  Leave suitcase in the car. After surgery it may be brought to your room.                Please read over the following fact sheets you were given: _____________________________________________________________________             [facility] - Preparing for Surgery Before surgery, you can play an important role.  Because skin is not sterile, your skin needs to be as free of germs as possible.  You can reduce the number of germs on your skin by washing with CHG (chlorahexidine gluconate) soap before surgery.  CHG is an antiseptic cleaner which kills germs and bonds with the skin to continue killing germs even after washing. Please DO NOT use if you have an allergy to CHG or antibacterial soaps.  If your skin becomes reddened/irritated stop using the CHG and inform your nurse when you arrive at Short Stay. Do not shave (including legs and underarms) for at least 48 hours prior to the first CHG shower.  You may shave your face/neck. Please follow these instructions carefully:  1.  Shower with CHG Soap  the night before surgery and the  morning of Surgery.  2.  If you choose to wash your hair, wash your hair first as usual with your  normal  shampoo.  3.  After you shampoo, rinse your hair and body thoroughly to remove the  shampoo.                           4.  Use CHG as you would any other liquid soap.  You can apply chg directly  to the skin and wash                       Gently with a scrungie or clean washcloth.  5.  Apply the CHG Soap to your body ONLY FROM THE NECK DOWN.   Do not use  on face/ open                           Wound or open sores. Avoid contact with eyes, ears mouth and genitals (private parts).                       Wash face,  Genitals (private parts) with your normal soap.             6.  Wash thoroughly, paying special attention to the area where your surgery  will be performed.  7.  Thoroughly rinse your body with warm water from the neck down.  8.  DO NOT shower/wash with your normal soap after using and rinsing off  the CHG Soap.                9.  Pat yourself dry with a clean towel.            10.  Wear clean pajamas.            11.  Place clean sheets on your bed the night of your first shower and do not  sleep with pets. Day of Surgery : Do not apply any lotions/deodorants the morning of surgery.  Please wear clean clothes to the hospital/surgery center.  FAILURE TO FOLLOW THESE INSTRUCTIONS MAY RESULT IN THE CANCELLATION OF YOUR SURGERY PATIENT SIGNATURE_________________________________  NURSE SIGNATURE__________________________________  ________________________________________________________________________   Angel Burns  An incentive spirometer is a tool that can help keep your lungs clear and active. This tool measures how well you are filling your lungs with each breath. Taking long deep breaths may help reverse or decrease the chance of developing breathing (pulmonary) problems (especially infection) following:  A long period of time when  you are unable to move or be active. BEFORE THE PROCEDURE   If the spirometer includes an indicator to show your best effort, your nurse or respiratory therapist will set it to a desired goal.  If possible, sit up straight or lean slightly forward. Try not to slouch.  Hold the incentive spirometer in an upright position. INSTRUCTIONS FOR USE  1. Sit on the edge of your bed if possible, or sit up as far as you can in bed or on a chair. 2. Hold the incentive spirometer in an upright position. 3. Breathe out normally. 4. Place the mouthpiece in your mouth and seal your lips tightly around it. 5. Breathe in slowly and as deeply as possible, raising the piston or the ball toward the top of the column. 6. Hold your breath for 3-5 seconds or for as long as possible. Allow the piston or ball to fall to the bottom of the column. 7. Remove the mouthpiece from your mouth and breathe out normally. 8. Rest for a few seconds and repeat Steps 1 through 7 at least 10 times every 1-2 hours when you are awake. Take your time and take a few normal breaths between deep breaths. 9. The spirometer may include an indicator to show your best effort. Use the indicator as a goal to work toward during each repetition. 10. After each set of 10 deep breaths, practice coughing to be sure your lungs are clear. If you have an incision (the cut made at the time of surgery), support your incision when coughing by placing a pillow or rolled up towels firmly against it. Once you are able to get out of bed, walk around  indoors and cough well. You may stop using the incentive spirometer when instructed by your caregiver.  RISKS AND COMPLICATIONS  Take your time so you do not get dizzy or light-headed.  If you are in pain, you may need to take or ask for pain medication before doing incentive spirometry. It is harder to take a deep breath if you are having pain. AFTER USE  Rest and breathe slowly and easily.  It can be  helpful to keep track of a log of your progress. Your caregiver can provide you with a simple table to help with this. If you are using the spirometer at home, follow these instructions: SEEK MEDICAL CARE IF:   You are having difficultly using the spirometer.  You have trouble using the spirometer as often as instructed.  Your pain medication is not giving enough relief while using the spirometer.  You develop fever of 100.5 F (38.1 C) or higher. SEEK IMMEDIATE MEDICAL CARE IF:   You cough up bloody sputum that had not been present before.  You develop fever of 102 F (38.9 C) or greater.  You develop worsening pain at or near the incision site. MAKE SURE YOU:   Understand these instructions.  Will watch your condition.  Will get help right away if you are not doing well or get worse. Document Released: 02/24/2007 Document Revised: 01/06/2012 Document Reviewed: 04/27/2007 [facility] Patient Information 2014 [place], [place].   ________________________________________________________________________

## 2017-04-28 NOTE — Progress Notes (Signed)
Talked with portable equipment ant put inorder for bari bed. Gave them surgery day and time and time pt. Will arrive to [facility]  9:15 am day 05/05/17

## 2017-04-28 NOTE — Telephone Encounter (Signed)
Spoke with patient about admission process for bariatric surgery with self pay.  Patient states understanding.  Questions answered

## 2017-04-29 ENCOUNTER — Encounter (HOSPITAL_COMMUNITY): Payer: Self-pay

## 2017-04-29 ENCOUNTER — Ambulatory Visit: Payer: Self-pay | Admitting: Neurology

## 2017-04-29 ENCOUNTER — Encounter (HOSPITAL_COMMUNITY)
Admission: RE | Admit: 2017-04-29 | Discharge: 2017-04-29 | Disposition: A | Discharge status: Home / Self Care | Payer: BLUE CROSS/BLUE SHIELD | Source: Ambulatory Visit | Attending: General Surgery | Admitting: General Surgery

## 2017-04-29 DIAGNOSIS — Z01812 Encounter for preprocedural laboratory examination: Secondary | ICD-10-CM | POA: Insufficient documentation

## 2017-04-29 HISTORY — DX: Sleep apnea, unspecified: G47.30

## 2017-04-29 HISTORY — DX: Other complications of anesthesia, initial encounter: T88.59XA

## 2017-04-29 HISTORY — DX: Adverse effect of unspecified anesthetic, initial encounter: T41.45XA

## 2017-04-29 HISTORY — DX: Unspecified asthma, uncomplicated: J45.909

## 2017-04-29 HISTORY — DX: Hidradenitis suppurativa: L73.2

## 2017-04-29 HISTORY — DX: Cardiac arrhythmia, unspecified: I49.9

## 2017-04-29 LAB — CBC WITH DIFFERENTIAL/PLATELET
BASOS ABS: 0 10*3/uL (ref 0.0–0.1)
Basophils Relative: 0 %
Eosinophils Absolute: 0.2 10*3/uL (ref 0.0–0.7)
Eosinophils Relative: 2 %
HEMATOCRIT: 40.9 % (ref 39.0–52.0)
Hemoglobin: 13.8 g/dL (ref 13.0–17.0)
LYMPHS PCT: 25 %
Lymphs Abs: 2.7 10*3/uL (ref 0.7–4.0)
MCH: 28.5 pg (ref 26.0–34.0)
MCHC: 33.7 g/dL (ref 30.0–36.0)
MCV: 84.3 fL (ref 78.0–100.0)
MONO ABS: 0.5 10*3/uL (ref 0.1–1.0)
Monocytes Relative: 5 %
NEUTROS ABS: 7.3 10*3/uL (ref 1.7–7.7)
Neutrophils Relative %: 68 %
Platelets: 276 10*3/uL (ref 150–400)
RBC: 4.85 MIL/uL (ref 4.22–5.81)
RDW: 13.2 % (ref 11.5–15.5)
WBC: 10.7 10*3/uL — AB (ref 4.0–10.5)

## 2017-04-29 LAB — COMPREHENSIVE METABOLIC PANEL
ALK PHOS: 59 U/L (ref 38–126)
ALT: 43 U/L (ref 17–63)
AST: 47 U/L — AB (ref 15–41)
Albumin: 3.9 g/dL (ref 3.5–5.0)
Anion gap: 8 (ref 5–15)
BILIRUBIN TOTAL: 0.6 mg/dL (ref 0.3–1.2)
BUN: 14 mg/dL (ref 6–20)
CO2: 24 mmol/L (ref 22–32)
CREATININE: 0.94 mg/dL (ref 0.61–1.24)
Calcium: 9.4 mg/dL (ref 8.9–10.3)
Chloride: 105 mmol/L (ref 101–111)
GFR calc Af Amer: >60 mL/min (ref >60)
Glucose, Bld: 108 mg/dL — ABNORMAL HIGH (ref 65–99)
Potassium: 3.9 mmol/L (ref 3.5–5.1)
Sodium: 137 mmol/L (ref 135–145)
TOTAL PROTEIN: 8.7 g/dL — AB (ref 6.5–8.1)

## 2017-04-29 NOTE — Progress Notes (Signed)
EKG 01/09/17 in epic

## 2017-05-05 ENCOUNTER — Ambulatory Visit (HOSPITAL_COMMUNITY)
Admission: RE | Admit: 2017-05-05 | Discharge: 2017-05-06 | Disposition: A | Discharge status: Home / Self Care | Payer: Self-pay | Source: Ambulatory Visit | Attending: General Surgery | Admitting: General Surgery

## 2017-05-05 ENCOUNTER — Encounter (HOSPITAL_COMMUNITY): Payer: Self-pay | Admitting: *Deleted

## 2017-05-05 ENCOUNTER — Encounter (HOSPITAL_COMMUNITY)
Admission: RE | Disposition: A | Discharge status: Home / Self Care | Payer: Self-pay | Source: Ambulatory Visit | Attending: General Surgery

## 2017-05-05 ENCOUNTER — Inpatient Hospital Stay (HOSPITAL_COMMUNITY): Payer: Self-pay | Admitting: Certified Registered Nurse Anesthetist

## 2017-05-05 DIAGNOSIS — Z6841 Body Mass Index (BMI) 40.0 and over, adult: Secondary | ICD-10-CM | POA: Insufficient documentation

## 2017-05-05 DIAGNOSIS — M549 Dorsalgia, unspecified: Secondary | ICD-10-CM | POA: Insufficient documentation

## 2017-05-05 DIAGNOSIS — Z79899 Other long term (current) drug therapy: Secondary | ICD-10-CM | POA: Insufficient documentation

## 2017-05-05 DIAGNOSIS — K219 Gastro-esophageal reflux disease without esophagitis: Secondary | ICD-10-CM | POA: Insufficient documentation

## 2017-05-05 DIAGNOSIS — Z87891 Personal history of nicotine dependence: Secondary | ICD-10-CM | POA: Insufficient documentation

## 2017-05-05 DIAGNOSIS — G4733 Obstructive sleep apnea (adult) (pediatric): Secondary | ICD-10-CM | POA: Insufficient documentation

## 2017-05-05 HISTORY — DX: Prediabetes: R73.03

## 2017-05-05 HISTORY — PX: LAPAROSCOPIC GASTRIC SLEEVE RESECTION: SHX5895

## 2017-05-05 LAB — HEMOGLOBIN AND HEMATOCRIT, BLOOD
HCT: 43.1 % (ref 39.0–52.0)
HEMOGLOBIN: 14.6 g/dL (ref 13.0–17.0)

## 2017-05-05 SURGERY — GASTRECTOMY, SLEEVE, LAPAROSCOPIC
Anesthesia: General | Site: Abdomen

## 2017-05-05 MED ORDER — BUPIVACAINE-EPINEPHRINE 0.25% -1:200000 IJ SOLN
INTRAMUSCULAR | Status: DC | PRN
  Administered 2017-05-05: 30 mL

## 2017-05-05 MED ORDER — CHLORHEXIDINE GLUCONATE CLOTH 2 % EX PADS: 6.0000 | MEDICATED_PAD | Freq: Once | CUTANEOUS | Status: DC

## 2017-05-05 MED ORDER — FENTANYL CITRATE (PF) 100 MCG/2ML IJ SOLN
INTRAMUSCULAR | Status: AC
  Administered 2017-05-05: 50 ug via INTRAVENOUS
  Filled 2017-05-05: qty 2

## 2017-05-05 MED ORDER — LIDOCAINE 2% (20 MG/ML) 5 ML SYRINGE
INTRAMUSCULAR | Status: AC
  Filled 2017-05-05: qty 5

## 2017-05-05 MED ORDER — SUGAMMADEX SODIUM 200 MG/2ML IV SOLN
INTRAVENOUS | Status: DC | PRN
  Administered 2017-05-05: 378.8 mg via INTRAVENOUS

## 2017-05-05 MED ORDER — PROMETHAZINE HCL 25 MG/ML IJ SOLN: 6.2500 mg | INTRAMUSCULAR | Status: DC | PRN

## 2017-05-05 MED ORDER — SODIUM CHLORIDE 0.9 % IJ SOLN
INTRAMUSCULAR | Status: DC | PRN
  Administered 2017-05-05: 40 mL via INTRAVENOUS

## 2017-05-05 MED ORDER — PROPOFOL 10 MG/ML IV BOLUS
INTRAVENOUS | Status: DC | PRN
  Administered 2017-05-05: 300 mg via INTRAVENOUS

## 2017-05-05 MED ORDER — CEFOTETAN DISODIUM-DEXTROSE 2-2.08 GM-% IV SOLR
2.0000 g | INTRAVENOUS | Status: AC
  Administered 2017-05-05: 2 g via INTRAVENOUS
  Filled 2017-05-05: qty 50

## 2017-05-05 MED ORDER — OXYCODONE HCL 5 MG/5ML PO SOLN
5.0000 mg | ORAL | Status: DC | PRN
  Administered 2017-05-06 (×3): 10 mg via ORAL
  Filled 2017-05-05: qty 10
  Filled 2017-05-05: qty 5
  Filled 2017-05-05: qty 10
  Filled 2017-05-05: qty 5

## 2017-05-05 MED ORDER — PROMETHAZINE HCL 25 MG/ML IJ SOLN
INTRAMUSCULAR | Status: AC
  Filled 2017-05-05: qty 1

## 2017-05-05 MED ORDER — LACTATED RINGERS IV SOLN: INTRAVENOUS | Status: DC

## 2017-05-05 MED ORDER — SUGAMMADEX SODIUM 500 MG/5ML IV SOLN
INTRAVENOUS | Status: AC
  Filled 2017-05-05: qty 5

## 2017-05-05 MED ORDER — APREPITANT 40 MG PO CAPS
40.0000 mg | ORAL_CAPSULE | ORAL | Status: AC
  Administered 2017-05-05: 40 mg via ORAL
  Filled 2017-05-05: qty 1

## 2017-05-05 MED ORDER — PANTOPRAZOLE SODIUM 40 MG IV SOLR
40.0000 mg | Freq: Every day | INTRAVENOUS | Status: DC
  Administered 2017-05-05: 40 mg via INTRAVENOUS
  Filled 2017-05-05: qty 40

## 2017-05-05 MED ORDER — PROPOFOL 10 MG/ML IV BOLUS
INTRAVENOUS | Status: AC
  Filled 2017-05-05: qty 40

## 2017-05-05 MED ORDER — MEPERIDINE HCL 50 MG/ML IJ SOLN: 6.2500 mg | INTRAMUSCULAR | Status: DC | PRN

## 2017-05-05 MED ORDER — LACTATED RINGERS IV SOLN
INTRAVENOUS | Status: DC
  Administered 2017-05-05: 10:00:00 via INTRAVENOUS

## 2017-05-05 MED ORDER — ROCURONIUM BROMIDE 50 MG/5ML IV SOSY
PREFILLED_SYRINGE | INTRAVENOUS | Status: DC | PRN
  Administered 2017-05-05 (×2): 10 mg via INTRAVENOUS
  Administered 2017-05-05: 50 mg via INTRAVENOUS

## 2017-05-05 MED ORDER — SCOPOLAMINE 1 MG/3DAYS TD PT72
1.0000 | MEDICATED_PATCH | TRANSDERMAL | Status: DC
  Administered 2017-05-05: 1.5 mg via TRANSDERMAL
  Filled 2017-05-05: qty 1

## 2017-05-05 MED ORDER — FENTANYL CITRATE (PF) 100 MCG/2ML IJ SOLN
INTRAMUSCULAR | Status: DC | PRN
  Administered 2017-05-05: 50 ug via INTRAVENOUS
  Administered 2017-05-05: 100 ug via INTRAVENOUS
  Administered 2017-05-05: 50 ug via INTRAVENOUS
  Administered 2017-05-05: 100 ug via INTRAVENOUS
  Administered 2017-05-05: 50 ug via INTRAVENOUS

## 2017-05-05 MED ORDER — FENTANYL CITRATE (PF) 100 MCG/2ML IJ SOLN
25.0000 ug | INTRAMUSCULAR | Status: DC | PRN
  Administered 2017-05-05 (×3): 50 ug via INTRAVENOUS

## 2017-05-05 MED ORDER — BUPIVACAINE LIPOSOME 1.3 % IJ SUSP
INTRAMUSCULAR | Status: DC | PRN
  Administered 2017-05-05: 20 mL

## 2017-05-05 MED ORDER — MIDAZOLAM HCL 2 MG/2ML IJ SOLN
INTRAMUSCULAR | Status: AC
  Filled 2017-05-05: qty 2

## 2017-05-05 MED ORDER — ONDANSETRON HCL 4 MG/2ML IJ SOLN
4.0000 mg | INTRAMUSCULAR | Status: DC | PRN
  Administered 2017-05-05 (×2): 4 mg via INTRAVENOUS
  Filled 2017-05-05 (×2): qty 2

## 2017-05-05 MED ORDER — STERILE WATER FOR IRRIGATION IR SOLN
Status: DC | PRN
  Administered 2017-05-05: 1000 mL

## 2017-05-05 MED ORDER — HEPARIN SODIUM (PORCINE) 5000 UNIT/ML IJ SOLN
5000.0000 [IU] | INTRAMUSCULAR | Status: AC
  Administered 2017-05-05: 5000 [IU] via SUBCUTANEOUS
  Filled 2017-05-05: qty 1

## 2017-05-05 MED ORDER — DEXAMETHASONE SODIUM PHOSPHATE 10 MG/ML IJ SOLN: INTRAMUSCULAR | Status: DC | PRN

## 2017-05-05 MED ORDER — 0.9 % SODIUM CHLORIDE (POUR BTL) OPTIME
TOPICAL | Status: DC | PRN
  Administered 2017-05-05: 1000 mL

## 2017-05-05 MED ORDER — MORPHINE SULFATE (PF) 2 MG/ML IV SOLN
1.0000 mg | INTRAVENOUS | Status: DC | PRN
  Administered 2017-05-05: 2 mg via INTRAVENOUS
  Administered 2017-05-05: 1 mg via INTRAVENOUS
  Administered 2017-05-05: 2 mg via INTRAVENOUS
  Filled 2017-05-05 (×4): qty 1

## 2017-05-05 MED ORDER — LACTATED RINGERS IR SOLN
Status: DC | PRN
  Administered 2017-05-05: 1000 mL

## 2017-05-05 MED ORDER — MIDAZOLAM HCL 5 MG/5ML IJ SOLN
INTRAMUSCULAR | Status: DC | PRN
  Administered 2017-05-05: 2 mg via INTRAVENOUS

## 2017-05-05 MED ORDER — DEXAMETHASONE SODIUM PHOSPHATE 10 MG/ML IJ SOLN
INTRAMUSCULAR | Status: AC
  Filled 2017-05-05: qty 1

## 2017-05-05 MED ORDER — METOCLOPRAMIDE HCL 5 MG/ML IJ SOLN
10.0000 mg | Freq: Once | INTRAMUSCULAR | Status: AC | PRN
  Administered 2017-05-05: 10 mg via INTRAVENOUS

## 2017-05-05 MED ORDER — GABAPENTIN 300 MG PO CAPS
300.0000 mg | ORAL_CAPSULE | ORAL | Status: AC
  Administered 2017-05-05: 300 mg via ORAL
  Filled 2017-05-05: qty 1

## 2017-05-05 MED ORDER — SUCCINYLCHOLINE CHLORIDE 200 MG/10ML IV SOSY
PREFILLED_SYRINGE | INTRAVENOUS | Status: DC | PRN
  Administered 2017-05-05: 140 mg via INTRAVENOUS

## 2017-05-05 MED ORDER — ACETAMINOPHEN 160 MG/5ML PO SOLN
325.0000 mg | ORAL | Status: DC | PRN
  Administered 2017-05-06 (×2): 650 mg via ORAL
  Filled 2017-05-05 (×2): qty 20.3

## 2017-05-05 MED ORDER — ROCURONIUM BROMIDE 50 MG/5ML IV SOSY
PREFILLED_SYRINGE | INTRAVENOUS | Status: AC
  Filled 2017-05-05: qty 5

## 2017-05-05 MED ORDER — ACETAMINOPHEN 325 MG PO TABS: 650.0000 mg | ORAL_TABLET | ORAL | Status: DC | PRN

## 2017-05-05 MED ORDER — SUCCINYLCHOLINE CHLORIDE 200 MG/10ML IV SOSY
PREFILLED_SYRINGE | INTRAVENOUS | Status: AC
  Filled 2017-05-05: qty 10

## 2017-05-05 MED ORDER — ONDANSETRON HCL 4 MG/2ML IJ SOLN
INTRAMUSCULAR | Status: AC
  Filled 2017-05-05: qty 2

## 2017-05-05 MED ORDER — LACTATED RINGERS IV SOLN
INTRAVENOUS | Status: DC
  Administered 2017-05-05: 19:00:00 via INTRAVENOUS

## 2017-05-05 MED ORDER — DEXAMETHASONE SODIUM PHOSPHATE 4 MG/ML IJ SOLN
4.0000 mg | INTRAMUSCULAR | Status: AC
  Administered 2017-05-05: 4 mg via INTRAVENOUS
  Filled 2017-05-05: qty 1

## 2017-05-05 MED ORDER — FENTANYL CITRATE (PF) 250 MCG/5ML IJ SOLN
INTRAMUSCULAR | Status: AC
  Filled 2017-05-05: qty 5

## 2017-05-05 MED ORDER — PHENYLEPHRINE 40 MCG/ML (10ML) SYRINGE FOR IV PUSH (FOR BLOOD PRESSURE SUPPORT)
PREFILLED_SYRINGE | INTRAVENOUS | Status: DC | PRN
  Administered 2017-05-05: 40 ug via INTRAVENOUS
  Administered 2017-05-05: 80 ug via INTRAVENOUS

## 2017-05-05 MED ORDER — FENTANYL CITRATE (PF) 100 MCG/2ML IJ SOLN
INTRAMUSCULAR | Status: AC
  Filled 2017-05-05: qty 2

## 2017-05-05 MED ORDER — LIDOCAINE 2% (20 MG/ML) 5 ML SYRINGE
INTRAMUSCULAR | Status: DC | PRN
  Administered 2017-05-05: 75 mg via INTRAVENOUS

## 2017-05-05 MED ORDER — SODIUM CHLORIDE 0.9 % IJ SOLN
INTRAMUSCULAR | Status: AC
  Filled 2017-05-05: qty 50

## 2017-05-05 MED ORDER — CELECOXIB 200 MG PO CAPS
400.0000 mg | ORAL_CAPSULE | ORAL | Status: AC
  Administered 2017-05-05: 400 mg via ORAL
  Filled 2017-05-05: qty 2

## 2017-05-05 MED ORDER — METOCLOPRAMIDE HCL 5 MG/ML IJ SOLN
INTRAMUSCULAR | Status: AC
  Administered 2017-05-05: 10 mg via INTRAVENOUS
  Filled 2017-05-05: qty 2

## 2017-05-05 MED ORDER — ENOXAPARIN SODIUM 30 MG/0.3ML ~~LOC~~ SOLN
30.0000 mg | Freq: Two times a day (BID) | SUBCUTANEOUS | Status: DC
  Administered 2017-05-06: 30 mg via SUBCUTANEOUS
  Filled 2017-05-05: qty 0.3

## 2017-05-05 MED ORDER — BUPIVACAINE LIPOSOME 1.3 % IJ SUSP
20.0000 mL | Freq: Once | INTRAMUSCULAR | Status: DC
  Filled 2017-05-05: qty 20

## 2017-05-05 MED ORDER — ONDANSETRON HCL 4 MG/2ML IJ SOLN
INTRAMUSCULAR | Status: DC | PRN
Start: 2017-05-05 — End: 2017-05-05
  Administered 2017-05-05: 4 mg via INTRAVENOUS

## 2017-05-05 MED ORDER — EVICEL 5 ML EX KIT
PACK | Freq: Once | CUTANEOUS | Status: DC
  Filled 2017-05-05: qty 1

## 2017-05-05 MED ORDER — PREMIER PROTEIN SHAKE
2.0000 [oz_av] | ORAL | Status: DC
  Administered 2017-05-06 (×5): 2 [oz_av] via ORAL

## 2017-05-05 SURGICAL SUPPLY — 57 items
APPLICATOR COTTON TIP 6IN STRL (MISCELLANEOUS) IMPLANT
APPLIER CLIP ROT 10 11.4 M/L (STAPLE)
APPLIER CLIP ROT 13.4 12 LRG (CLIP)
BLADE SURG SZ11 CARB STEEL (BLADE) ×2 IMPLANT
CABLE HIGH FREQUENCY MONO STRZ (ELECTRODE) ×2 IMPLANT
CHLORAPREP W/TINT 26ML (MISCELLANEOUS) ×4 IMPLANT
CLIP APPLIE ROT 10 11.4 M/L (STAPLE) IMPLANT
CLIP APPLIE ROT 13.4 12 LRG (CLIP) IMPLANT
DERMABOND ADVANCED (GAUZE/BANDAGES/DRESSINGS) ×1
DERMABOND ADVANCED .7 DNX12 (GAUZE/BANDAGES/DRESSINGS) ×1 IMPLANT
DEVICE SUT QUICK LOAD TK 5 (STAPLE) IMPLANT
DEVICE SUT TI-KNOT TK 5X26 (MISCELLANEOUS) IMPLANT
DEVICE SUTURE ENDOST 10MM (ENDOMECHANICALS) IMPLANT
DRAPE UTILITY XL STRL (DRAPES) ×2 IMPLANT
ELECT REM PT RETURN 15FT ADLT (MISCELLANEOUS) ×2 IMPLANT
GAUZE SPONGE 4X4 12PLY STRL (GAUZE/BANDAGES/DRESSINGS) IMPLANT
GLOVE BIOGEL PI IND STRL 7.5 (GLOVE) ×1 IMPLANT
GLOVE BIOGEL PI INDICATOR 7.5 (GLOVE) ×1
GLOVE ECLIPSE 7.5 STRL STRAW (GLOVE) ×2 IMPLANT
GOWN STRL REUS W/TWL XL LVL3 (GOWN DISPOSABLE) ×10 IMPLANT
GRASPER SUT TROCAR 14GX15 (MISCELLANEOUS) IMPLANT
HOVERMATT SINGLE USE (MISCELLANEOUS) ×2 IMPLANT
IRRIG SUCT STRYKERFLOW 2 WTIP (MISCELLANEOUS)
IRRIGATION SUCT STRKRFLW 2 WTP (MISCELLANEOUS) IMPLANT
KIT BASIN OR (CUSTOM PROCEDURE TRAY) ×2 IMPLANT
MARKER SKIN DUAL TIP RULER LAB (MISCELLANEOUS) ×2 IMPLANT
NEEDLE SPNL 22GX3.5 QUINCKE BK (NEEDLE) ×2 IMPLANT
PACK UNIVERSAL I (CUSTOM PROCEDURE TRAY) ×2 IMPLANT
RELOAD STAPLER BLUE 60MM (STAPLE) ×1 IMPLANT
RELOAD STAPLER GOLD 60MM (STAPLE) ×1 IMPLANT
RELOAD STAPLER GREEN 60MM (STAPLE) ×1 IMPLANT
SCISSORS LAP 5X45 EPIX DISP (ENDOMECHANICALS) IMPLANT
SET IRRIG TUBING LAPAROSCOPIC (IRRIGATION / IRRIGATOR) ×2 IMPLANT
SHEARS HARMONIC ACE PLUS 45CM (MISCELLANEOUS) ×2 IMPLANT
SLEEVE ADV FIXATION 5X100MM (TROCAR) ×2 IMPLANT
SLEEVE GASTRECTOMY 36FR VISIGI (MISCELLANEOUS) ×2 IMPLANT
SOLUTION ANTI FOG 6CC (MISCELLANEOUS) ×2 IMPLANT
SPONGE LAP 18X18 X RAY DECT (DISPOSABLE) ×2 IMPLANT
STAPLER ECHELON LONG 60 440 (INSTRUMENTS) ×2 IMPLANT
STAPLER RELOAD BLUE 60MM (STAPLE) ×2
STAPLER RELOAD GOLD 60MM (STAPLE) ×2
STAPLER RELOAD GREEN 60MM (STAPLE) ×2
SUT MNCRL AB 4-0 PS2 18 (SUTURE) ×2 IMPLANT
SUT SURGIDAC NAB ES-9 0 48 120 (SUTURE) IMPLANT
SUT VICRYL 0 TIES 12 18 (SUTURE) ×2 IMPLANT
SYR 10ML ECCENTRIC (SYRINGE) ×2 IMPLANT
SYR 20CC LL (SYRINGE) ×4 IMPLANT
SYSTEM WECK SHIELD CLOSURE (TROCAR) IMPLANT
TIP RIGID 35CM EVICEL (HEMOSTASIS) IMPLANT
TOWEL OR 17X26 10 PK STRL BLUE (TOWEL DISPOSABLE) ×2 IMPLANT
TOWEL OR NON WOVEN STRL DISP B (DISPOSABLE) ×2 IMPLANT
TROCAR ADV FIXATION 5X100MM (TROCAR) ×2 IMPLANT
TROCAR BLADELESS 15MM (ENDOMECHANICALS) ×2 IMPLANT
TROCAR BLADELESS OPT 5 100 (ENDOMECHANICALS) ×2 IMPLANT
TUBING CONNECTING 10 (TUBING) IMPLANT
TUBING ENDO SMARTCAP PENTAX (MISCELLANEOUS) IMPLANT
TUBING INSUF HEATED (TUBING) ×2 IMPLANT

## 2017-05-05 NOTE — Transfer of Care (Signed)
Immediate Anesthesia Transfer of Care Note  Patient: Angel Burns  Procedure(s) Performed: Procedure(s): LAPAROSCOPIC GASTRIC SLEEVE RESECTION (N/A)  Patient Location: PACU  Anesthesia Type:General  Level of Consciousness:  sedated, patient cooperative and responds to stimulation  Airway & Oxygen Therapy:Patient Spontanous Breathing and Patient connected to face mask oxgen  Post-op Assessment:  Report given to PACU RN and Post -op Vital signs reviewed and stable  Post vital signs:  Reviewed and stable  Last Vitals:  Vitals:   05/05/17 0932  BP: (!) 147/84  Pulse: 88  Resp: 18  Temp: 37 C    Complications: No apparent anesthesia complications

## 2017-05-05 NOTE — Anesthesia Procedure Notes (Signed)
Procedure Name: Intubation Date/Time: 05/05/2017 11:29 AM Performed by: Elyn PeersALLEN, Caswell Alvillar J Pre-anesthesia Checklist: Patient identified, Emergency Drugs available, Suction available, Patient being monitored and Timeout performed Patient Re-evaluated:Patient Re-evaluated prior to inductionOxygen Delivery Method: Circle system utilized Preoxygenation: Pre-oxygenation with 100% oxygen Intubation Type: IV induction Ventilation: Mask ventilation without difficulty Laryngoscope Size: Miller and 3 Grade View: Grade II Tube type: Oral Tube size: 7.5 mm Number of attempts: 1 Airway Equipment and Method: Stylet Placement Confirmation: ETT inserted through vocal cords under direct vision,  positive ETCO2 and breath sounds checked- equal and bilateral Secured at: 22 cm Tube secured with: Tape Dental Injury: Teeth and Oropharynx as per pre-operative assessment

## 2017-05-05 NOTE — Progress Notes (Signed)
PT refused CPAP QHS at this time.

## 2017-05-05 NOTE — Op Note (Signed)
Preoperative Diagnosis: MORBID OBESITY  Postoprative Diagnosis: MORBID OBESITY  Procedure: Procedure(s): LAPAROSCOPIC GASTRIC SLEEVE RESECTION   Surgeon: Glenna FellowsHoxworth, Hollee Fate T   Assistants: Luretha MurphyMatthew Martin  Anesthesia:  General endotracheal anesthesia  Indications: Patient is a 31 year old male with progressive morbid obesity unresponsive to medical management who presented at a BMI of 63.9, 61 following preoperative weight loss plan and comorbidities of chronic back pain and obstructive sleep apnea. Following an extensive preoperative workup and discussion detailed elsewhere plan to proceed with laparoscopic sleeve gastrectomy for treatment of his morbid obesity.    Procedure Detail:  Patient was brought to the operating room, placed in the supine position on the operating table, and general endotracheal anesthesia induced. He received preoperative broad-spectrum IV antibiotics and subcutaneous heparin. PAS were placed. The abdomen was widely sterilely prepped and draped. Patient timeout was performed and correct procedure verified. Access was obtained with a 5 mm Optiview trocar in the left upper quadrant without difficulty and pneumoperitoneum established. No evidence of trocar injury. Under direct vision a 5 mm trocar was placed laterally in the right upper quadrant, a 15 mm trocar more medially in the right upper quadrant at the base of the falciform ligament, a 5 mm trocar above in the left of the umbilicus for the camera port and an additional 5 mm trocar in the left lateral abdomen. A bilateral T AP block was performed with dilute Exparel under direct vision. The patient was placed in steep reverse Trendelenburg position. Through a 5 mm subxiphoid site the Nason retractor was placed in the left lobe of the liver elevated with excellent exposure of the stomach and the hiatus. The liver was noted to be slightly enlarged and nodular. There was no evidence of hiatal hernia on exam and his upper GI  series had been normal and he is without significant reflux. Beginning at the mid greater curve of the vasculature was dissected with Harmonic scalpel along the greater curve and the lesser sac entered. The dissection progressed proximally along the greater curve and short gastric vessels were sequentially divided with Harmonic scalpel. The dissection continued proximally along the fundus which was dissected away from the spleen. The esophageal fat pad was mobilized and the stomach mobilized and left crus completely dissected and exposed. The dissection then continued distally along the greater curve until it was freed to a point measured 5 cm from the pylorus. A few avascular posterior attachments were then divided until the stomach was completely free along its lesser curve vasculature. A 36 French VisiG was passed orally and positioned along the lesser curve with its tip at the pylorus. With the stomach splayed out symmetrically the tube was placed on continuous suction. The sleeve was begun at about 5 cm from the pylorus with an additional firing of the 60 mm green load stapler going up toward but staying well away from the incisura. A second firing of the green load 60 mm stapler carried the staple line up past the incisura allowing an extra centimeter or so of room around the tube at the incisura. The sleeve was then completed with 1 gold firing and 3 blue firings of the 60 mm stapler with the last firing angling just out lateral to the esophageal fat pad. The sleeve was tensely insufflated with the VisiG tube and under saline irrigation there was no evidence of leak. The sleeve was symmetrical without twisting or scalloping and no narrowing at the incisura. Following this the sleeve was desufflated and the VisiG tube removed. The  gastric specimen was removed through the 15 mm trocar site after dilating the fascia slightly. This fascial defect was closed with interrupted 0 Vicryl. The abdomen was carefully  inspected for hemostasis and there was no evidence of bleeding or injury or other problems. All CO2 was evacuated and trochars removed after removing the Nathanson retractor under direct vision. Skin incisions were closed with subcuticular Monocryl and Dermabond. Sponge needle and instrument counts were correct.    Findings: As above  Estimated Blood Loss:  Minimal         Drains: None  Blood Given: none          Specimens: Greater curvature of stomach        Complications:  * No complications entered in OR log *         Disposition: PACU - hemodynamically stable.         Condition: stable

## 2017-05-05 NOTE — Anesthesia Preprocedure Evaluation (Signed)
Anesthesia Evaluation  Patient identified by MRN, date of birth, ID band Patient awake    Reviewed: Allergy & Precautions, NPO status , Patient's Chart, lab work & pertinent test results  Airway Mallampati: III  TM Distance: >3 FB Neck ROM: Full    Dental no notable dental hx.    Pulmonary asthma , sleep apnea , former smoker,    Pulmonary exam normal breath sounds clear to auscultation       Cardiovascular negative cardio ROS Normal cardiovascular exam Rhythm:Regular Rate:Normal     Neuro/Psych negative neurological ROS  negative psych ROS   GI/Hepatic negative GI ROS, Neg liver ROS,   Endo/Other  Morbid obesity  Renal/GU negative Renal ROS  negative genitourinary   Musculoskeletal negative musculoskeletal ROS (+)   Abdominal   Peds negative pediatric ROS (+)  Hematology negative hematology ROS (+)   Anesthesia Other Findings   Reproductive/Obstetrics negative OB ROS                            Anesthesia Physical Anesthesia Plan  ASA: III  Anesthesia Plan: General   Post-op Pain Management:    Induction: Intravenous  PONV Risk Score and Plan: 2 and Ondansetron and Dexamethasone  Airway Management Planned: Oral ETT  Additional Equipment:   Intra-op Plan:   Post-operative Plan: Extubation in OR  Informed Consent: I have reviewed the patients History and Physical, chart, labs and discussed the procedure including the risks, benefits and alternatives for the proposed anesthesia with the patient or authorized representative who has indicated his/her understanding and acceptance.   Dental advisory given  Plan Discussed with: CRNA  Anesthesia Plan Comments:         Anesthesia Quick Evaluation

## 2017-05-05 NOTE — H&P (Signed)
History of Present Illness Angel Burns. Latoyia Tecson MD; 04/23/2017 12:22 PM) The patient is a 31 year old male who presents with obesity. He returns for his preoperative visit prior to planned laparoscopic sleeve gastrectomy for morbid obesity. The patient gives a history of progressive obesity since about 5 years ago when he injured his back which affected his mobility. He was active and athletic in his early years and experienced slow weight gain until that time. He has been unable to lose any significant amount of weight despite multiple attempts at medical management. Obesity has been affecting the patient in a number of ways including worsening back pain and increasing difficulty with mobility and activities of daily living and enjoying activities with his family. He has experienced worsening hidradenitis as he has gained weight. Significant co-morbid illnesses have developed including chronic back pain. Per the stop-bang questionnaire done today he has at high risk for having obstructive sleep apnea. The patient has been to our initial information seminar, researched surgical options thoroughly, and is interested in sleeve gastrectomy. He has completed preoperative workup. No concerns on psychologic or nutrition evaluation. Upper GI series was negative. We did obtain a sleep study preoperatively which was positive and he is now on a CPAP at home with improved symptoms. He has been on a BMI reduction program and has been able to lose about 30 pounds and BMI reduced from almost 64 to 61.5.    Problem List/Past Medical Angel Burns. Sarvesh Meddaugh, MD; 04/23/2017 12:22 PM) MORBID OBESITY WITH BMI OF 60.0-69.9, ADULT (E66.01)   Diagnostic Studies History Angel Burns. Maida Widger, MD; 04/23/2017 12:22 PM) Colonoscopy  never  Allergies Jessie Foot, CMA; 04/23/2017 11:58 AM) No Known Drug Allergies 11/29/2016 Allergies Reconciled   Medication History Jessie Foot, CMA; 04/23/2017 11:58 AM) No  Current Medications Medications Reconciled  Social History Angel Burns. Avel Ogawa, MD; 04/23/2017 12:22 PM) Alcohol use  Occasional alcohol use. Caffeine use  Carbonated beverages, Coffee. No drug use  Tobacco use  Former smoker.  Other Problems Angel Burns. Akacia Boltz, MD; 04/23/2017 12:22 PM) Back Pain  Gastroesophageal Reflux Disease  Migraine Headache  Other disease, cancer, significant illness   Vitals Jessie Foot CMA; 04/23/2017 11:59 AM) 04/23/2017 11:58 AM Weight: 429.2 lb Height: 70in Body Surface Area: 2.89 m Body Mass Index: 61.58 kg/m  Temp.: 78F  Pulse: 104 (Regular)  BP: 110/80 (Sitting, Left Arm, Standard)       Physical Exam Angel Burns. Parke Jandreau MD; 04/23/2017 12:23 PM) The physical exam findings are as follows: Note:General: Alert, morbidly obese Caucasian male, in no distress Skin: Warm and dry . HEENT: No palpable masses or thyromegaly. Sclera nonicteric. Lymph nodes: No cervical, supraclavicular, or inguinal nodes palpable. Lungs: Breath sounds clear and equal. No wheezing or increased work of breathing. Cardiovascular: Regular rate and rhythm without murmer. No JVD or edema. Peripheral pulses intact. No carotid bruits. Abdomen: Nondistended. Soft and nontender. No masses palpable. No organomegaly. No palpable hernias. Extremities: 1+ edema, nojoint swelling or deformity. No chronic venous stasis changes. Neurologic: Alert and fully oriented. Gait normal. No focal weakness. Psychiatric: Normal mood and affect. Thought content appropriate with normal judgement and insight    Assessment & Plan Angel Burns. Shamela Haydon MD; 04/23/2017 12:25 PM) MORBID OBESITY WITH BMI OF 60.0-69.9, ADULT (E66.01) Impression: Patient with progressive morbid obesity unresponsive to multiple efforts at medical management who presented with a BMI of 63.9, currently 61, and comorbidities of chronic back pain and obstructive sleep apnea. I believe there would  be very significant medical  benefit from surgical weight loss. After our discussion of surgical options currently available the patient has decided to proceed with laparoscopic sleeve gastrectomy . We have discussed the nature of the problem and the risks of remaining morbidly obese. We discussed laparoscopic sleeve gastrectomy in detail including the nature of the procedure, expected hospitalization and recovery, and major risks of anesthetic complications, bleeding, blood clots and pulmonary emboli, leakage and infection and long-term risks of stricture, , bowel obstruction, reflux, nutritional deficiencies, and failure to lose weight or weight regain. We discussed these problems could lead to death. We discussed that the procedure is not reversible. We discussed possible need for conversion to gastric bypass or other procedure. Consent form was again reviewed today. He is given prescriptions for oxycodone as well as Protonix and Zofran. All questions were answered and he is ready to proceed.

## 2017-05-05 NOTE — Interval H&P Note (Signed)
History and Physical Interval Note:  05/05/2017 10:57 AM  Angel Burns  has presented today for surgery, with the diagnosis of MORBID OBESITY  The various methods of treatment have been discussed with the patient and family. After consideration of risks, benefits and other options for treatment, the patient has consented to  Procedure(s): LAPAROSCOPIC GASTRIC SLEEVE RESECTION WITH UPPER ENDO (N/A) as a surgical intervention .  The patient's history has been reviewed, patient examined, no change in status, stable for surgery.  I have reviewed the patient's chart and labs.  Questions were answered to the patient's satisfaction.     [name] T

## 2017-05-05 NOTE — Anesthesia Postprocedure Evaluation (Signed)
Anesthesia Post Note  Patient: Angel Burns  Procedure(s) Performed: Procedure(s) (LRB): LAPAROSCOPIC GASTRIC SLEEVE RESECTION (N/A)     Patient location during evaluation: PACU Anesthesia Type: General Level of consciousness: awake and alert Pain management: pain level controlled Vital Signs Assessment: post-procedure vital signs reviewed and stable Respiratory status: spontaneous breathing, nonlabored ventilation, respiratory function stable and patient connected to nasal cannula oxygen Cardiovascular status: blood pressure returned to baseline and stable Postop Assessment: no signs of nausea or vomiting Anesthetic complications: no    Last Vitals:  Vitals:   05/05/17 1415 05/05/17 1440  BP: 123/73 (!) 143/77  Pulse: 67 (!) 58  Resp: 15 16  Temp: (!) 36.3 C 36.6 C    Last Pain:  Vitals:   05/05/17 1415  TempSrc:   PainSc: Asleep                 Phillips Groutarignan, Raegyn Renda

## 2017-05-06 LAB — CBC WITH DIFFERENTIAL/PLATELET
Basophils Absolute: 0 10*3/uL (ref 0.0–0.1)
Basophils Relative: 0 %
Eosinophils Absolute: 0 10*3/uL (ref 0.0–0.7)
Eosinophils Relative: 0 %
HEMATOCRIT: 38.5 % — AB (ref 39.0–52.0)
HEMOGLOBIN: 13 g/dL (ref 13.0–17.0)
LYMPHS ABS: 2 10*3/uL (ref 0.7–4.0)
Lymphocytes Relative: 16 %
MCH: 28.3 pg (ref 26.0–34.0)
MCHC: 33.8 g/dL (ref 30.0–36.0)
MCV: 83.7 fL (ref 78.0–100.0)
MONOS PCT: 6 %
Monocytes Absolute: 0.8 10*3/uL (ref 0.1–1.0)
NEUTROS ABS: 9.8 10*3/uL — AB (ref 1.7–7.7)
NEUTROS PCT: 78 %
Platelets: 248 10*3/uL (ref 150–400)
RBC: 4.6 MIL/uL (ref 4.22–5.81)
RDW: 13 % (ref 11.5–15.5)
WBC: 12.6 10*3/uL — AB (ref 4.0–10.5)

## 2017-05-06 MED ORDER — PANTOPRAZOLE SODIUM 40 MG PO TBEC: 40.0000 mg | DELAYED_RELEASE_TABLET | Freq: Every day | ORAL | 1 refills | Status: AC

## 2017-05-06 MED ORDER — OXYCODONE HCL 5 MG/5ML PO SOLN: 5.0000 mg | ORAL | 0 refills | Status: AC | PRN

## 2017-05-06 NOTE — Progress Notes (Signed)
Patient alert and oriented, pain is controlled. Patient is tolerating fluids, advanced to protein shake today, patient is tolerating well.  Reviewed Gastric sleeve discharge instructions with patient and patient is able to articulate understanding.  Provided information on BELT program, Support Group and [facility] outpatient pharmacy. All questions answered, will continue to monitor.   Ruthmary Occhipinti RN  

## 2017-05-06 NOTE — Plan of Care (Signed)
Problem: Food- and Nutrition-Related Knowledge Deficit (NB-1.1) Goal: Nutrition education Formal process to instruct or train a patient/client in a skill or to impart knowledge to help patients/clients voluntarily manage or modify food choices and eating behavior to maintain or improve health. Outcome: Completed/Met Date Met: 05/06/17 Nutrition Education Note  Received consult for diet education per DROP protocol.   Discussed 2 week post op diet with pt. Emphasized that liquids must be non carbonated, non caffeinated, and sugar free. Fluid goals discussed. Pt to follow up with outpatient bariatric RD for further diet progression after 2 weeks. Multivitamins and minerals also reviewed. Teach back method used, pt expressed understanding, expect good compliance.   Diet: First 2 Weeks  You will see the nutritionist about two (2) weeks after your surgery. The nutritionist will increase the types of foods you can eat if you are handling liquids well:  If you have severe vomiting or nausea and cannot handle clear liquids lasting longer than 1 day, call your surgeon  Protein Shake  Drink at least 2 ounces of shake 5-6 times per day  Each serving of protein shakes (usually 8 - 12 ounces) should have a minimum of:  15 grams of protein  And no more than 5 grams of carbohydrate  Goal for protein each day:  Men = 80 grams per day  Women = 60 grams per day  Protein powder may be added to fluids such as non-fat milk or Lactaid milk or Soy milk (limit to 35 grams added protein powder per serving)   Hydration  Slowly increase the amount of water and other clear liquids as tolerated (See Acceptable Fluids)  Slowly increase the amount of protein shake as tolerated  Sip fluids slowly and throughout the day  May use sugar substitutes in small amounts (no more than 6 - 8 packets per day; i.e. Splenda)   Fluid Goal  The first goal is to drink at least 8 ounces of protein shake/drink per day (or as directed  by the nutritionist); some examples of protein shakes are Premier Protein, [employer], [employer], EAS Edge HP, and Unjury. See handout from pre-op Bariatric Education Class:  Slowly increase the amount of protein shake you drink as tolerated  You may find it easier to slowly sip shakes throughout the day  It is important to get your proteins in first  Your fluid goal is to drink 64 - 100 ounces of fluid daily  It may take a few weeks to build up to this  32 oz (or more) should be clear liquids  And  32 oz (or more) should be full liquids (see below for examples)  Liquids should not contain sugar, caffeine, or carbonation   Clear Liquids:  Water or Sugar-free flavored water (i.e. Fruit H2O, Propel)  Decaffeinated coffee or tea (sugar-free)  Crystal Lite, Wyler's Lite, Minute Maid Lite  Sugar-free Jell-O  Bouillon or broth  Sugar-free Popsicle: *Less than 20 calories each; Limit 1 per day   Full Liquids:  Protein Shakes/Drinks + 2 choices per day of other full liquids  Full liquids must be:  No More Than 12 grams of Carbs per serving  No More Than 3 grams of Fat per serving  Strained low-fat cream soup  Non-Fat milk  Fat-free Lactaid Milk  Sugar-free yogurt (Dannon Lite & Fit, [place] yogurt, Oikos Zero)   Clayton Bibles, MS, RD, LDN Pager: [phone] After Hours Pager: [phone]

## 2017-05-06 NOTE — Progress Notes (Signed)
Discharge instructions completed with verbalized understanding through teach back including follow up and when to call the doctor. No scripts at dc. Assessment unchanged. Angel Burns completed bariatric teaching earlier in shift. Wife to pick pt up after shift change. Reassurance given.

## 2017-05-06 NOTE — Discharge Summary (Signed)
  Patient ID: Angel Burns [id number] 31 y.o. 21-Nov-1985  05/05/2017  Discharge date and time: 05/06/2017   Admitting Physician: Glenna FellowsHOXWORTH,Brennin Durfee T  Discharge Physician: Glenna FellowsHOXWORTH,Darral Rishel T  Admission Diagnoses: MORBID OBESITY  Discharge Diagnoses: Same  Operations: Procedure(s): LAPAROSCOPIC GASTRIC SLEEVE RESECTION  Admission Condition: good  Discharged Condition: good  Indication for Admission: Patient is a 31 year old male with progressive morbid obesity unresponsive to medical management who presents at a BMI of 63.9 and comorbidities of chronic back pain and obstructive sleep apnea. After extensive preoperative workup and discussion detailed elsewhere he is electively admitted for laparoscopic sleeve gastrectomy.  Hospital Course: On the morning of admission the patient underwent an uneventful laparoscopic sleeve gastrectomy. The following morning he denies pain or nausea. He is tolerating fluids and transitioning to protein shakes. Ambulatory. His abdomen is soft and nontender. Incisions are clean and dry. He is felt ready for discharge later today.   Disposition: Home  Patient Instructions:  Allergies as of 05/06/2017      Reactions   Other Anaphylaxis   banana peppers   Propoxyphene Nausea Only   DARVOCET      Medication List    TAKE these medications   acetaminophen 500 MG tablet Commonly known as:  TYLENOL Take 1,000 mg by mouth every 6 (six) hours as needed.   oxyCODONE 5 MG/5ML solution Commonly known as:  ROXICODONE Take 5-10 mLs (5-10 mg total) by mouth every 4 (four) hours as needed for moderate pain or severe pain.   pantoprazole 40 MG tablet Commonly known as:  PROTONIX Take 1 tablet (40 mg total) by mouth daily.       Activity: activity as tolerated Diet: Bariatric protein shakes Wound Care: none needed  Follow-up:  With Dr. Johna SheriffHoxworth in 3 weeks.  Signed: Mariella SaaBenjamin T Lesia Monica MD, FACS  05/06/2017, 7:34 AM

## 2017-05-06 NOTE — Progress Notes (Signed)
Patient discharged via wheelchair.

## 2017-05-06 NOTE — Discharge Instructions (Signed)

## 2017-05-15 ENCOUNTER — Telehealth (HOSPITAL_COMMUNITY): Payer: Self-pay

## 2017-05-15 NOTE — Telephone Encounter (Signed)
Called for post op follow up.  Voicemail left for patient with return contact information.

## 2017-05-20 ENCOUNTER — Encounter: Payer: BLUE CROSS/BLUE SHIELD | Attending: General Surgery | Admitting: Registered"

## 2017-05-20 DIAGNOSIS — Z713 Dietary counseling and surveillance: Secondary | ICD-10-CM | POA: Insufficient documentation

## 2017-05-20 DIAGNOSIS — Z6841 Body Mass Index (BMI) 40.0 and over, adult: Secondary | ICD-10-CM | POA: Insufficient documentation

## 2017-05-20 DIAGNOSIS — E669 Obesity, unspecified: Secondary | ICD-10-CM

## 2017-05-21 NOTE — Progress Notes (Signed)
Bariatric Class:  Appt start time: 1530 end time:  1630.  2 Week Post-Operative Nutrition Class  Patient was seen on 05/20/2017 for Post-Operative Nutrition education at [facility].    Pt states he has experienced some constipation and abdominal pain.    Surgery date: 05/05/2017 Surgery type: Sleeve gastrectomy Start weight at [facility]: 450.9 Weight today: 398.5 Weight change: 52.4  TANITA  BODY COMP RESULTS  05/20/2017   BMI (kg/m^2) N/A   Fat Mass (lbs)    Fat Free Mass (lbs)    Total Body Water (lbs)    The following the learning objectives were met by the patient during this course:  Identifies Phase 3A (Soft, High Proteins) Dietary Goals and will begin from 2 weeks post-operatively to 2 months post-operatively  Identifies appropriate sources of fluids and proteins   States protein recommendations and appropriate sources post-operatively  Identifies the need for appropriate texture modifications, mastication, and bite sizes when consuming solids  Identifies appropriate multivitamin and calcium sources post-operatively  Describes the need for physical activity post-operatively and will follow MD recommendations  States when to call healthcare provider regarding medication questions or post-operative complications  Handouts given during class include:  Phase 3A: Soft, High Protein Diet Handout  Follow-Up Plan: Patient will follow-up at [facility] in 6 weeks for 2 month post-op nutrition visit for diet advancement per MD.

## 2017-06-18 ENCOUNTER — Encounter: Payer: Commercial Managed Care - PPO | Attending: General Surgery | Admitting: Registered"

## 2017-06-18 ENCOUNTER — Encounter: Payer: Self-pay | Admitting: Registered"

## 2017-06-18 DIAGNOSIS — Z713 Dietary counseling and surveillance: Secondary | ICD-10-CM | POA: Insufficient documentation

## 2017-06-18 DIAGNOSIS — Z6841 Body Mass Index (BMI) 40.0 and over, adult: Secondary | ICD-10-CM | POA: Insufficient documentation

## 2017-06-18 DIAGNOSIS — E669 Obesity, unspecified: Secondary | ICD-10-CM

## 2017-06-18 NOTE — Patient Instructions (Addendum)
-   Continue to track food and fluid intake using Baritastic App.   Goals:  Follow Phase 3B: High Protein + Non-Starchy Vegetables  Eat 3-6 small meals/snacks, every 3-5 hrs  Increase lean protein foods to meet 80g goal  Increase fluid intake to 64oz +  Avoid drinking 15 minutes before, during and 30 minutes after eating  Aim for >30 min of physical activity daily

## 2017-06-18 NOTE — Progress Notes (Signed)
Follow-up visit:  8 Weeks Post-Operative Sleeve Gastrectomy Surgery  Medical Nutrition Therapy:  Appt start time: 2:20 end time: 3:00.  Primary concerns today: Post-operative Bariatric Surgery Nutrition Management.  Non scale victories: "unable to fit old clothes, easier to move, going up stairs improvement, fitting into clothes that I have not been able to wear for years, more room in seat when driving/riding"  Surgery date: 05/05/2017 Surgery type: Sleeve gastrectomy Start weight at [facility]: 450.9 lbs Weight today: 392.6 lbs Weight change: 5.9 lbs from 398.5 last visit 05/20/2017 Total weight lost: 58.3 lbs Weight loss goal: none stated   TANITA  BODY COMP RESULTS  06/18/2017   BMI (kg/m^2) 56.3   Fat Mass (lbs) 204.6   Fat Free Mass (lbs) 188.0   Total Body Water (lbs) N/A    Pt has recently moved to [place] with family and still in the unpacking process. Pt states he has tried tuna; makes him nauseas. Pt states it hard to handle chicken as well. Pt states he has tried pizza twice and it has become easier to tolerate. Pt states he has tolerated some tomato-based items ok. Pt states he is tired at times and that affects his mood. Pt states he has appt with surgeon in 6 weeks and will travel from [place] for his appt, but needs to collaborate schedules at home before finalizing appt. Pt states he will call to make nutrition follow-up appt for same day as surgeon appt.    Preferred Learning Style:   No preference indicated   Learning Readiness:   Ready  Change in progress  24-hr recall: B (AM): 3 eggs (12g) or Smoothie-banana, PB2, skim milk (31g) Snk (AM): sometimes peanut butter crackers  L (PM): [place], cheese, low carb wraps (21g) or smoothie or burger w/o bun  Snk (PM): cheese (6g)  D (PM): 2 oz grilled chicken (14g) or greek yogurt (15g) Snk (PM): none  Fluid intake: Vitamin water zero, water; ~ 60 oz daily  Estimated total protein intake: 60-80g daily  Medications: See  list Supplementation: Yes, taking   Using straws: no Drinking while eating: no Having you been chewing well: yes Chewing/swallowing difficulties: no Changes in vision: no Changes to mood/headaches: mood has gotten worse due to feeling tired, no Hair loss/Changes to skin/Changes to nails: no, no, no Any difficulty focusing or concentrating: no Sweating: no Dizziness/Lightheaded: no Palpitations: no  Carbonated beverages: no N/V/D/C/GAS: sometimes with tuna, no, no, constipation, no Abdominal Pain: no Dumping syndrome: no Last Lap-Band fill: N/A  Recent physical activity:  Unable to do right now due to back injury  Progress Towards Goal(s):  In progress.  Handouts given during visit include:  Phase 3B: High protein and NS vegetables   Nutritional Diagnosis:  Inadequate fluid intake As related to bariatric post-op recommendations.  As evidenced by pt report of less than 64 fluid ounces daily.    Intervention:  Nutrition education and counseling. RD educated and counseled pt on importance of meeting protein and fluid needs on a daily basis.   Goals:  Follow Phase 3B: High Protein + Non-Starchy Vegetables  Eat 3-6 small meals/snacks, every 3-5 hrs  Increase lean protein foods to meet 80g goal  Increase fluid intake to 64oz +  Avoid drinking 15 minutes before, during and 30 minutes after eating  Aim for >30 min of physical activity daily - Continue to track food and fluid intake using [employer] App.   Teaching Method Utilized:  Visual Auditory Hands on  Barriers to learning/adherence  to lifestyle change: none  Demonstrated degree of understanding via:  Teach Back   Monitoring/Evaluation:  Dietary intake, exercise, lap band fills, and body weight. Follow up in 6 weeks for 3 month post-op visit.

## 2018-02-07 IMAGING — RF DG UGI W/ KUB
11 of 20 series · 12 of 24 positions shown · non-contrast
Comparison: None.

CLINICAL DATA: 30-year-old male preoperative study for bariatric
surgery. Initial encounter.

EXAM:
UPPER GI SERIES WITH KUB
TECHNIQUE: After obtaining a scout radiograph a routine upper GI series was
performed using thin and high density barium.
FLUOROSCOPY TIME:  Fluoroscopy Time:  1 minutes 54 seconds
Radiation Exposure Index (if provided by the fluoroscopic device):
Number of Acquired Spot Images: 0

[Series 2: t abdomen supine · 0.15mm/px · 1 of 1 slices shown]
[im 1/1]
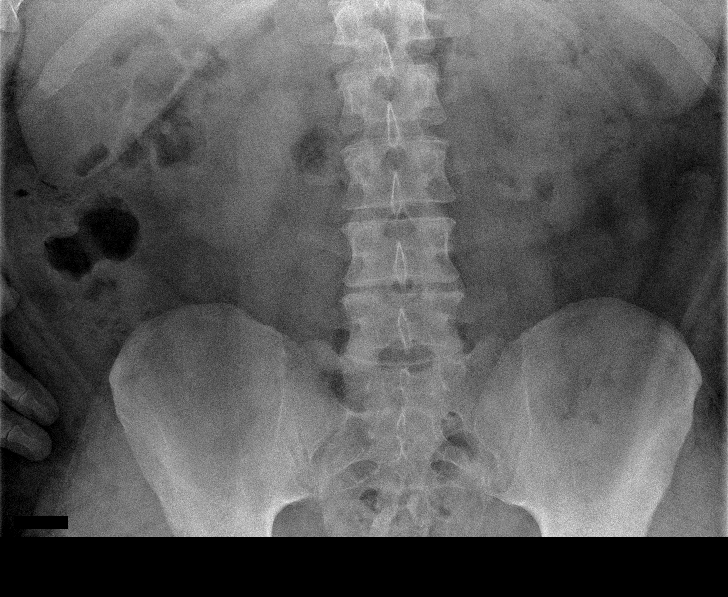

[Series 3: cp_standard · 0.51mm/px · 1 of 45 frames shown (1 of 10)]
[frame 23/45]
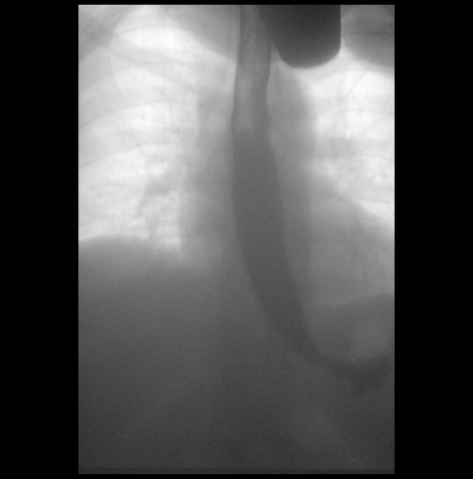

[Series 4: cp_standard · 0.25mm/px · 1 of 1 slices shown (2 of 10)]
[im 1/1]
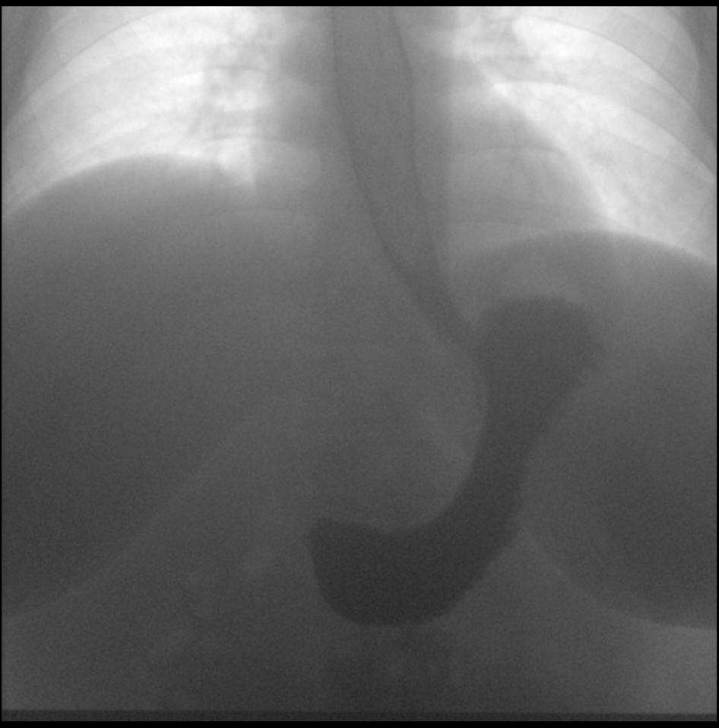

[Series 6: cp_standard · 0.34mm/px · 2 of 69 frames shown (3 of 10)]
[frame 3/69]
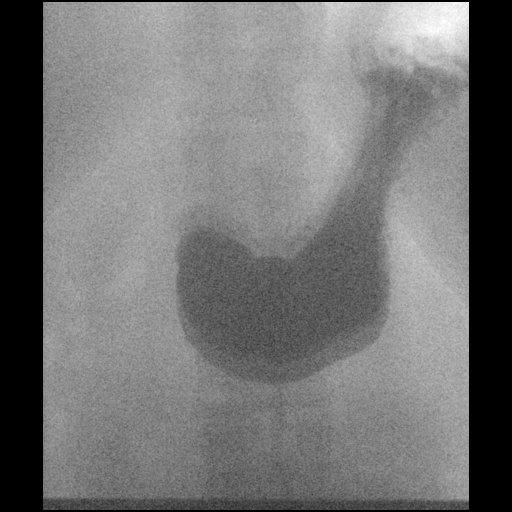
[frame 59/69]
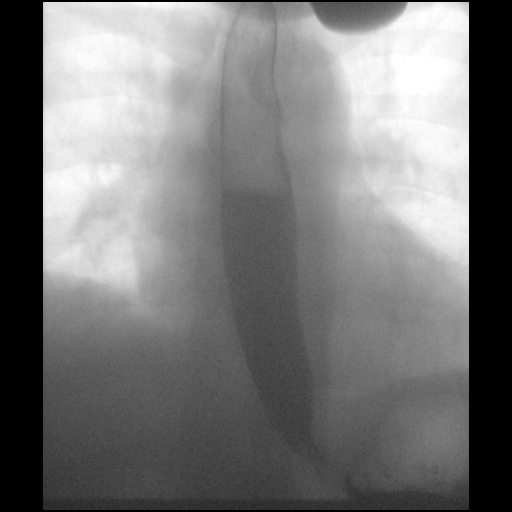

[Series 8: cp_standard · 0.17mm/px · 1 of 1 slices shown (4 of 10)]
[im 1/1]
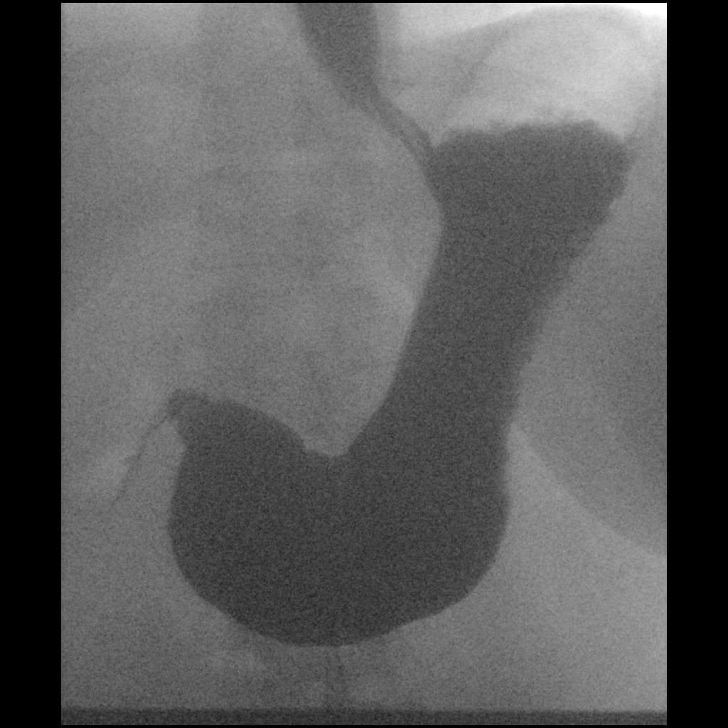

[Series 10: cp_standard · 0.17mm/px · 1 of 1 slices shown (5 of 10)]
[im 1/1]
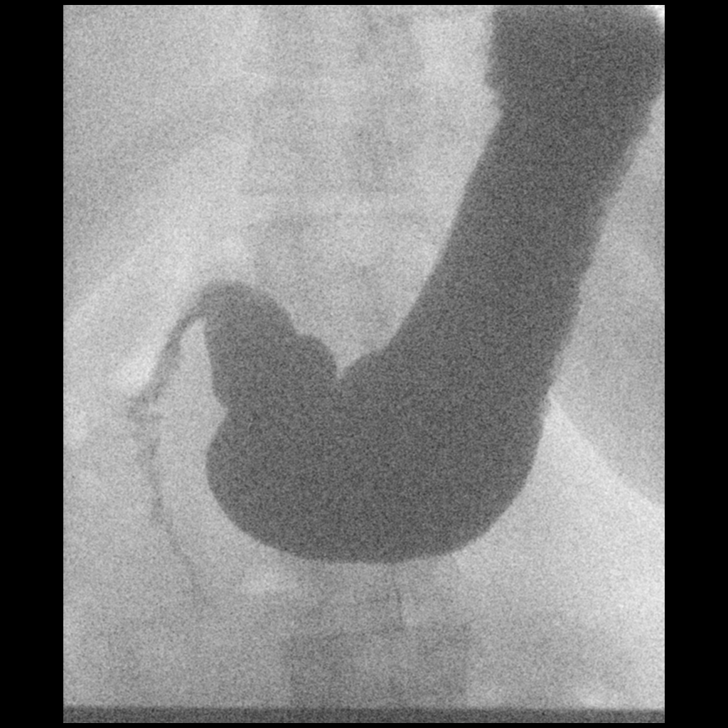

[Series 13: cp_standard · 0.17mm/px · 1 of 1 slices shown (6 of 10)]
[im 1/1]
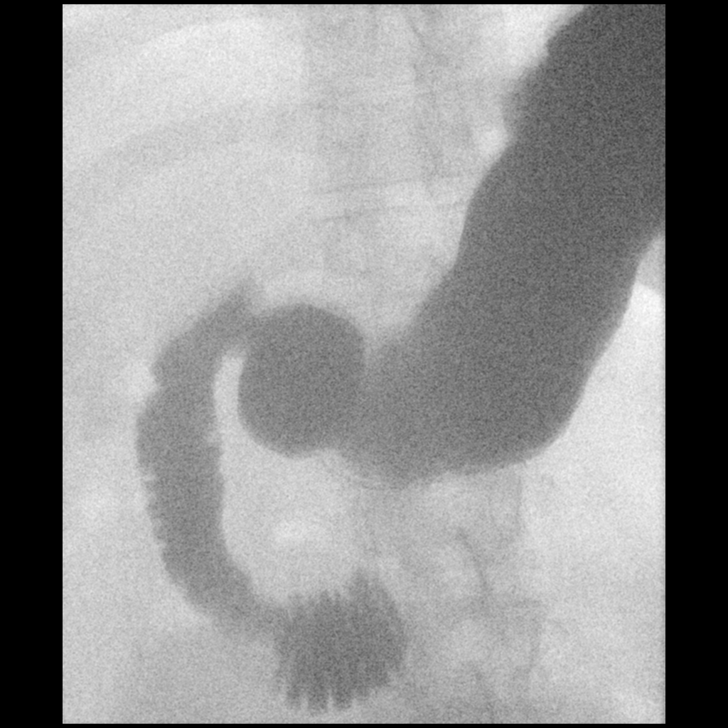

[Series 15: cp_standard · 0.17mm/px · 1 of 1 slices shown (7 of 10)]
[im 1/1]
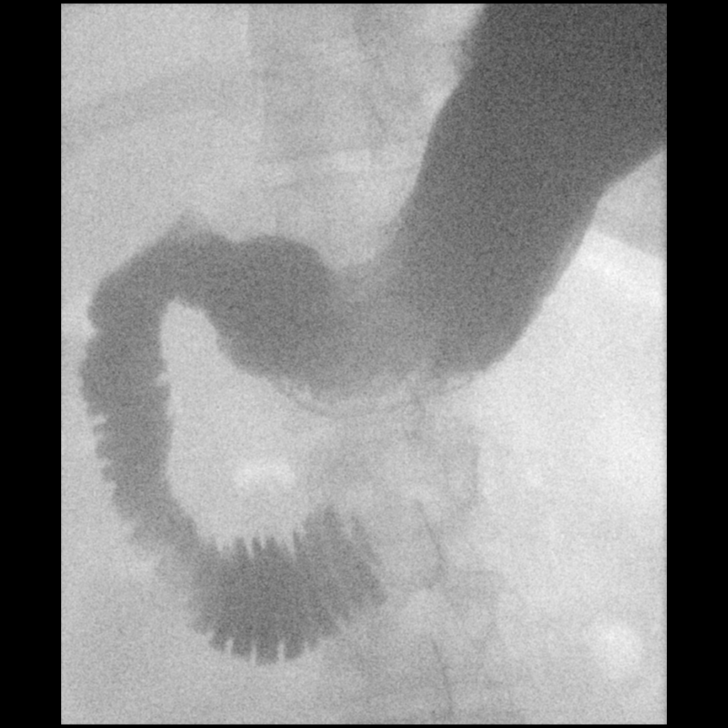

[Series 17: cp_standard · 0.17mm/px · 1 of 1 slices shown (8 of 10)]
[im 1/1]
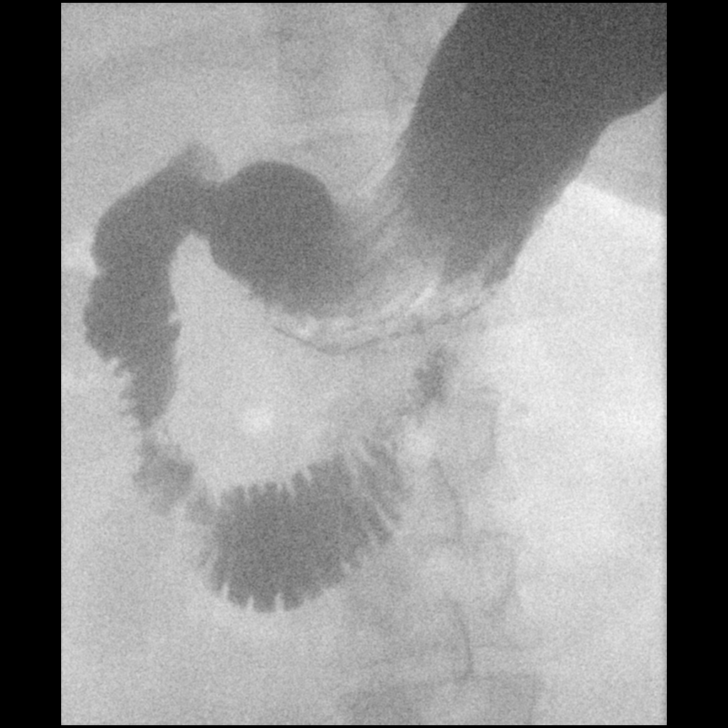

[Series 20: cp_standard · 0.26mm/px · 1 of 1 slices shown (9 of 10)]
[im 1/1]
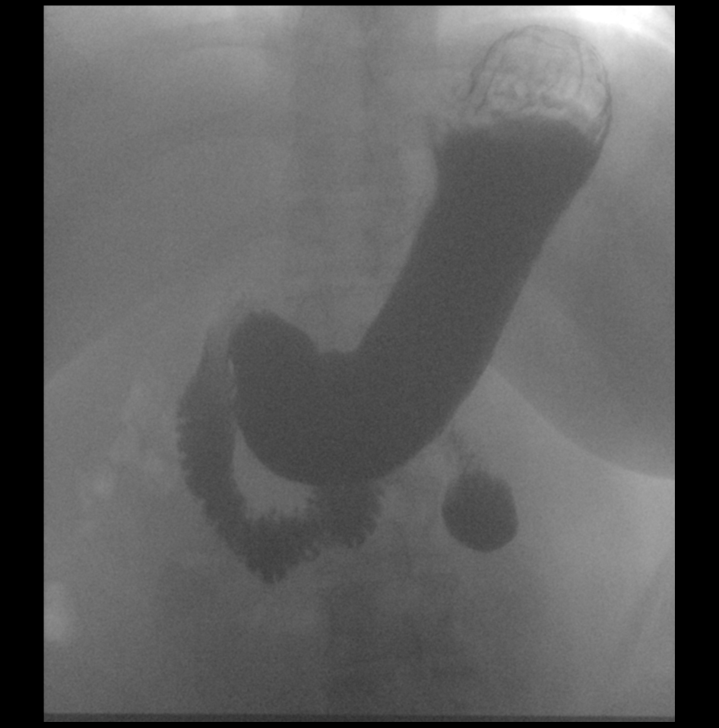

[Series 22: cp_standard · 0.26mm/px · 1 of 1 slices shown (10 of 10)]
[im 1/1]
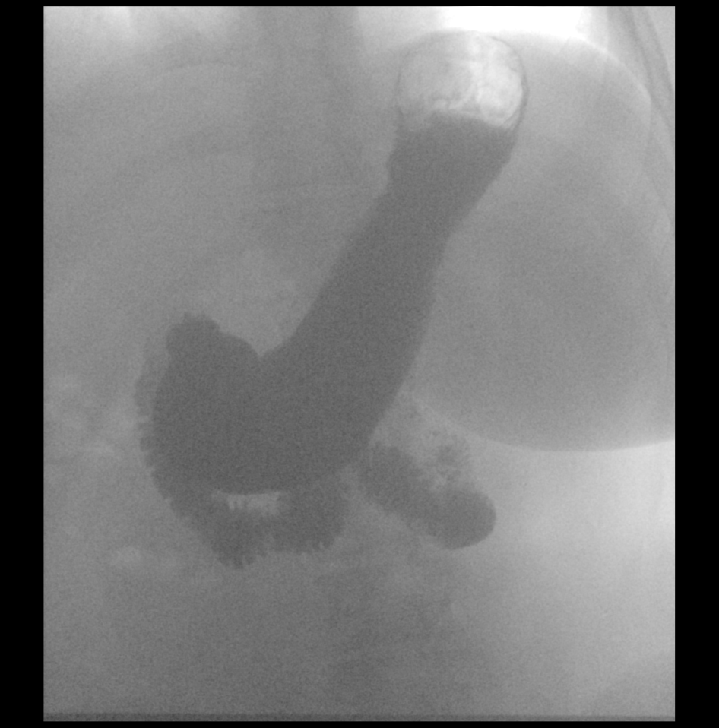

[12 of 24 positions shown; findings below may reference images not displayed]

FINDINGS: Preprocedural scout views of the abdomen and pelvis. Mild elevation
of the right hemidiaphragm, otherwise normal lung bases. Non
obstructed bowel gas pattern. No osseous abnormality identified.

A double single study was undertaken and the patient tolerated this
well and without difficulty.

No obstruction to the forward flow of contrast throughout the
esophagus and into the stomach. Normal esophageal course and
contour.

Normal gastroesophageal junction. Normal gastric contour. Prompt
gastric emptying.

Normal duodenum C loop and normal position of the ligament of
Treitz. The proximal small bowel loop then is located in the midline
and appears normal.

No gastroesophageal reflux occurred.
IMPRESSION: Normal upper GI study.

## 2018-12-31 ENCOUNTER — Encounter (HOSPITAL_COMMUNITY): Payer: Self-pay

## 2019-11-16 ENCOUNTER — Encounter (HOSPITAL_COMMUNITY): Payer: Self-pay

## 2020-11-22 ENCOUNTER — Encounter (HOSPITAL_COMMUNITY): Payer: Self-pay | Admitting: *Deleted

## 2021-11-29 ENCOUNTER — Encounter (HOSPITAL_COMMUNITY): Payer: Self-pay | Admitting: *Deleted

## 2022-11-22 ENCOUNTER — Encounter (HOSPITAL_COMMUNITY): Payer: Self-pay | Admitting: *Deleted

## 2023-11-26 ENCOUNTER — Encounter (HOSPITAL_COMMUNITY): Payer: Self-pay | Admitting: *Deleted

## 2024-08-04 NOTE — Anesthesia Postprocedure Evaluation (Signed)
 I, [name], MD, have performed a post-anesthetic evaluation and exam of Angel Burns.  The patient's PACU/ICU flowsheet has been reviewed, including appropriate parameters of respiratory function, cardiovascular function, mental status, temperature, pain level, presence of nausea and/or vomiting, and state of hydration.  The patient is Awake and Alert.  The patient is able to answer questions appropriately and has participated in this post-anesthetic evaluation.  The patient does not need further post-anesthesia follow up.    Case: [id number] Anesthesia Start Date/Time: 07/20/24 1346   Procedure: Right foot Achilles tendon repair with V-Y, FHL tendon transfer, splint application  [phone], [phone], [phone] (Right: Ankle)   Anesthesia type: general   Diagnosis:      Rupture of right Achilles tendon, initial encounter [S86.011A]     Pain, joint, ankle and foot, right [M25.571]   Pre-op diagnosis: Right foot Achilles tendon rupture   Location: [facility] / [facility]   Surgeons: Ozell Alm Grieve, DPM       Final Anesthesia Type: general  Evaluation: Vitals Value Taken Time  BP 131/68 07/20/24 20:30  Temp 36 C (96.8 F) 07/20/24 20:15  Pulse    Resp 18 07/20/24 20:30  SpO2 98 % 07/20/24 20:30  Heart Rate (Monitored) 51 07/20/24 20:30   Electronically Signed By: [name], MD 08/04/2024 6:37 PM [facility] ANES POST:  Postop note completed     Anesthesia Quality     [id number] Documentation of Current Medications in the Medical Record   Incomplete      No documentation found        MIPS404 Anesthesiology Smoking Abstinence  Incomplete      No documentation found        [id number] Perioperative Temperature Management  Incomplete      No documentation found        MIPS430 Prevention of Post-Operative Nausea and Vomiting (PONV) -  Combination Therapy  Incomplete      No documentation found        [id number] Prevention of Post-Operative Vomiting (POV) -  Combination  Therapy (Pediatrics)  Incomplete      No documentation found        MIPS477 Multimodal Pain Management  Incomplete      No documentation found        AQI71 Ambulatory Glucose Management  Incomplete     AQI71a Ambulatory Point-of-Care Glucose Testing  Incomplete      No documentation found         AQI71b Ambulatory Hyperglycemia Control  Incomplete      No documentation found         AQI71c Follow-Up Glucose Check for Patients Receiving Insulin   Incomplete      No documentation found         AQI71d Hyperglycemia Management Patient Education  Incomplete      No documentation found            ABG44 Low Flow Inhalational General Anesthesia  Incomplete      No documentation found         Measure Summary    Status Total      Performance Met 0     Performance Not Met 0     Excluded 0     Incomplete 8

## 2024-11-26 ENCOUNTER — Encounter (HOSPITAL_COMMUNITY): Payer: Self-pay | Admitting: *Deleted
